# Patient Record
Sex: Male | Born: 1950 | Race: White | Hispanic: Refuse to answer | Marital: Married | Smoking: Never smoker
Health system: Southern US, Community
[De-identification: ages and names within clinical notes are randomized; demographics above are authoritative.]

## PROBLEM LIST (undated history)

## (undated) DIAGNOSIS — J45909 Unspecified asthma, uncomplicated: Secondary | ICD-10-CM

## (undated) DIAGNOSIS — T7840XA Allergy, unspecified, initial encounter: Secondary | ICD-10-CM

## (undated) DIAGNOSIS — M1990 Unspecified osteoarthritis, unspecified site: Secondary | ICD-10-CM

## (undated) DIAGNOSIS — K219 Gastro-esophageal reflux disease without esophagitis: Secondary | ICD-10-CM

## (undated) DIAGNOSIS — C61 Malignant neoplasm of prostate: Secondary | ICD-10-CM

## (undated) DIAGNOSIS — I639 Cerebral infarction, unspecified: Secondary | ICD-10-CM

## (undated) DIAGNOSIS — K649 Unspecified hemorrhoids: Secondary | ICD-10-CM

## (undated) DIAGNOSIS — G7000 Myasthenia gravis without (acute) exacerbation: Secondary | ICD-10-CM

## (undated) DIAGNOSIS — I10 Essential (primary) hypertension: Secondary | ICD-10-CM

## (undated) DIAGNOSIS — H269 Unspecified cataract: Secondary | ICD-10-CM

## (undated) HISTORY — DX: Unspecified hemorrhoids: K64.9

## (undated) HISTORY — PX: OTHER SURGICAL HISTORY: SHX169

## (undated) HISTORY — PX: WISDOM TOOTH EXTRACTION: SHX21

## (undated) HISTORY — PX: PROSTATE BIOPSY: SHX241

## (undated) HISTORY — DX: Unspecified cataract: H26.9

## (undated) HISTORY — DX: Gastro-esophageal reflux disease without esophagitis: K21.9

## (undated) HISTORY — DX: Myasthenia gravis without (acute) exacerbation: G70.00

## (undated) HISTORY — PX: TONSILLECTOMY: SUR1361

## (undated) HISTORY — DX: Allergy, unspecified, initial encounter: T78.40XA

## (undated) HISTORY — DX: Unspecified asthma, uncomplicated: J45.909

## (undated) HISTORY — DX: Essential (primary) hypertension: I10

## (undated) HISTORY — PX: KNEE ARTHROSCOPY: SUR90

---

## 2009-12-17 ENCOUNTER — Ambulatory Visit: Payer: Self-pay | Admitting: Urology

## 2009-12-22 ENCOUNTER — Ambulatory Visit: Payer: Self-pay | Admitting: Urology

## 2009-12-23 ENCOUNTER — Ambulatory Visit: Payer: Self-pay | Admitting: Cardiovascular Disease

## 2011-04-27 HISTORY — PX: OTHER SURGICAL HISTORY: SHX169

## 2011-04-27 HISTORY — PX: COLONOSCOPY: SHX174

## 2011-11-12 DIAGNOSIS — M1A9XX0 Chronic gout, unspecified, without tophus (tophi): Secondary | ICD-10-CM | POA: Insufficient documentation

## 2011-12-16 DIAGNOSIS — Z9889 Other specified postprocedural states: Secondary | ICD-10-CM | POA: Insufficient documentation

## 2013-04-11 DIAGNOSIS — J45909 Unspecified asthma, uncomplicated: Secondary | ICD-10-CM | POA: Insufficient documentation

## 2013-04-11 DIAGNOSIS — E669 Obesity, unspecified: Secondary | ICD-10-CM | POA: Insufficient documentation

## 2013-05-11 DIAGNOSIS — R0600 Dyspnea, unspecified: Secondary | ICD-10-CM | POA: Insufficient documentation

## 2017-01-28 DIAGNOSIS — G7001 Myasthenia gravis with (acute) exacerbation: Secondary | ICD-10-CM | POA: Insufficient documentation

## 2017-11-30 ENCOUNTER — Telehealth: Payer: Self-pay | Admitting: Internal Medicine

## 2017-12-08 ENCOUNTER — Encounter: Payer: Self-pay | Admitting: Internal Medicine

## 2018-02-07 ENCOUNTER — Other Ambulatory Visit: Payer: Self-pay

## 2018-02-07 ENCOUNTER — Ambulatory Visit (AMBULATORY_SURGERY_CENTER): Payer: Medicare Other | Admitting: *Deleted

## 2018-02-07 VITALS — Ht 70.5 in | Wt 296.2 lb

## 2018-02-07 DIAGNOSIS — Z86010 Personal history of colonic polyps: Secondary | ICD-10-CM

## 2018-02-07 MED ORDER — SUPREP BOWEL PREP KIT 17.5-3.13-1.6 GM/177ML PO SOLN: 1.0000 | Freq: Once | ORAL | 0 refills | Status: AC

## 2018-02-07 NOTE — Progress Notes (Signed)
Patient denies any allergies to egg or soy products. Patient denies complications with anesthesia/sedation.  Patient denies oxygen use at home and denies diet medications. Patient denies information on Colonoscopy.

## 2018-02-20 ENCOUNTER — Encounter: Payer: Self-pay | Admitting: Internal Medicine

## 2018-02-20 ENCOUNTER — Ambulatory Visit (AMBULATORY_SURGERY_CENTER): Payer: Medicare Other | Admitting: Internal Medicine

## 2018-02-20 VITALS — BP 135/82 | HR 78 | Temp 97.7°F | Resp 15 | Ht 70.0 in | Wt 296.0 lb

## 2018-02-20 DIAGNOSIS — Z86010 Personal history of colonic polyps: Secondary | ICD-10-CM | POA: Diagnosis present

## 2018-02-20 DIAGNOSIS — D123 Benign neoplasm of transverse colon: Secondary | ICD-10-CM | POA: Diagnosis not present

## 2018-02-20 DIAGNOSIS — K635 Polyp of colon: Secondary | ICD-10-CM

## 2018-02-20 DIAGNOSIS — D124 Benign neoplasm of descending colon: Secondary | ICD-10-CM

## 2018-02-20 DIAGNOSIS — D125 Benign neoplasm of sigmoid colon: Secondary | ICD-10-CM

## 2018-02-20 MED ORDER — SODIUM CHLORIDE 0.9 % IV SOLN: 500.0000 mL | Freq: Once | INTRAVENOUS | Status: DC

## 2018-02-20 NOTE — Progress Notes (Signed)
PT taken to PACU. Monitors in place. VSS. Report given to RN. 

## 2018-02-20 NOTE — Op Note (Signed)
[facility] Patient Name: Vincent Liu Procedure Date: 02/20/2018 9:56 AM MRN: [id number] Endoscopist: Jerene Bears , MD Age: 67 Referring MD:  Date of Birth: Nov 29, 1950 Gender: Male Account #: [id number] Procedure:                Colonoscopy Indications:              High risk colon cancer surveillance: Personal                            history of colonic polyps Medicines:                Monitored Anesthesia Care Procedure:                Pre-Anesthesia Assessment:                           - Prior to the procedure, a History and Physical                            was performed, and patient medications and                            allergies were reviewed. The patient's tolerance of                            previous anesthesia was also reviewed. The risks                            and benefits of the procedure and the sedation                            options and risks were discussed with the patient.                            All questions were answered, and informed consent                            was obtained. Prior Anticoagulants: The patient has                            taken no previous anticoagulant or antiplatelet                            agents. ASA Grade Assessment: III - A patient with                            severe systemic disease. After reviewing the risks                            and benefits, the patient was deemed in                            satisfactory condition to undergo the procedure.  After obtaining informed consent, the colonoscope                            was passed under direct vision. Throughout the                            procedure, the patient's blood pressure, pulse, and                            oxygen saturations were monitored continuously. The                            Colonoscope was introduced through the anus and                            advanced to the cecum, identified by  appendiceal                            orifice and ileocecal valve. The colonoscopy was                            performed without difficulty. The patient tolerated                            the procedure well. The quality of the bowel                            preparation was good. The ileocecal valve,                            appendiceal orifice, and rectum were photographed. Scope In: 10:01:50 AM Scope Out: 10:16:44 AM Scope Withdrawal Time: 0 hours 13 minutes 5 seconds  Total Procedure Duration: 0 hours 14 minutes 54 seconds  Findings:                 The digital rectal exam was normal.                           Two sessile polyps were found in the transverse                            colon. The polyps were 4 to 5 mm in size. These                            polyps were removed with a cold snare. Resection                            and retrieval were complete.                           Three sessile polyps were found in the descending                            colon. The polyps were 3 to  4 mm in size. These                            polyps were removed with a cold snare. Resection                            and retrieval were complete.                           A 6 mm polyp was found in the sigmoid colon. The                            polyp was sessile. The polyp was removed with a                            cold snare. Resection and retrieval were complete.                           Multiple small and large-mouthed diverticula were                            found in the sigmoid colon and descending colon.                           Internal hemorrhoids were found during                            retroflexion. The hemorrhoids were small. Complications:            No immediate complications. Estimated Blood Loss:     Estimated blood loss was minimal. Impression:               - Two 4 to 5 mm polyps in the transverse colon,                            removed with a cold  snare. Resected and retrieved.                           - Three 3 to 4 mm polyps in the descending colon,                            removed with a cold snare. Resected and retrieved.                           - One 6 mm polyp in the sigmoid colon, removed with                            a cold snare. Resected and retrieved.                           - Moderate diverticulosis in the sigmoid colon and  in the descending colon.                           - Small internal hemorrhoids. Recommendation:           - Patient has a contact number available for                            emergencies. The signs and symptoms of potential                            delayed complications were discussed with the                            patient. Return to normal activities tomorrow.                            Written discharge instructions were provided to the                            patient.                           - Resume previous diet.                           - Continue present medications.                           - Await pathology results.                           - Repeat colonoscopy is recommended for                            surveillance. The colonoscopy date will be                            determined after pathology results from today's                            exam become available for review. Jerene Bears, MD 02/20/2018 10:21:02 AM This report has been signed electronically.

## 2018-02-20 NOTE — Progress Notes (Signed)
Called to room to assist during endoscopic procedure.  Patient ID and intended procedure confirmed with present staff. Received instructions for my participation in the procedure from the performing physician.  

## 2018-02-20 NOTE — Progress Notes (Signed)
No problems noted in the recovery room. Maw   IV was removed at 10:36 By Sundra Aland, RN and a clean, dry, gauze dressing was applied.  A nurse had charted IV was removed at 8:00' ish witch was incorrect and I could not change or edit the IV.  maw

## 2018-02-20 NOTE — Progress Notes (Signed)
Pt. Reports no change in his medical or surgical history since his pre-visit 02/07/2018.

## 2018-02-20 NOTE — Patient Instructions (Signed)
YOU HAD AN ENDOSCOPIC PROCEDURE TODAY AT THE [facility]:   Refer to the procedure report that was given to you for any specific questions about what was found during the examination.  If the procedure report does not answer your questions, please call your gastroenterologist to clarify.  If you requested that your care partner not be given the details of your procedure findings, then the procedure report has been included in a sealed envelope for you to review at your convenience later.  YOU SHOULD EXPECT: Some feelings of bloating in the abdomen. Passage of more gas than usual.  Walking can help get rid of the air that was put into your GI tract during the procedure and reduce the bloating. If you had a lower endoscopy (such as a colonoscopy or flexible sigmoidoscopy) you may notice spotting of blood in your stool or on the toilet paper. If you underwent a bowel prep for your procedure, you may not have a normal bowel movement for a few days.  Please Note:  You might notice some irritation and congestion in your nose or some drainage.  This is from the oxygen used during your procedure.  There is no need for concern and it should clear up in a day or so.  SYMPTOMS TO REPORT IMMEDIATELY:   Following lower endoscopy (colonoscopy or flexible sigmoidoscopy):  Excessive amounts of blood in the stool  Significant tenderness or worsening of abdominal pains  Swelling of the abdomen that is new, acute  Fever of 100F or higher   For urgent or emergent issues, a gastroenterologist can be reached at any hour by calling [phone].   DIET:  We do recommend a small meal at first, but then you may proceed to your regular diet.  Drink plenty of fluids but you should avoid alcoholic beverages for 24 hours.  ACTIVITY:  You should plan to take it easy for the rest of today and you should NOT DRIVE or use heavy machinery until tomorrow (because of the sedation medicines used during the test).     FOLLOW UP: Our staff will call the number listed on your records the next business day following your procedure to check on you and address any questions or concerns that you may have regarding the information given to you following your procedure. If we do not reach you, we will leave a message.  However, if you are feeling well and you are not experiencing any problems, there is no need to return our call.  We will assume that you have returned to your regular daily activities without incident.  If any biopsies were taken you will be contacted by phone or by letter within the next 1-3 weeks.  Please call us at [phone] if you have not heard about the biopsies in 3 weeks.    SIGNATURES/CONFIDENTIALITY: You and/or your care partner have signed paperwork which will be entered into your electronic medical record.  These signatures attest to the fact that that the information above on your After Visit Summary has been reviewed and is understood.  Full responsibility of the confidentiality of this discharge information lies with you and/or your care-partner.   Handouts were given to your care partner on polyps, diverticulosis, and hemorrhoids. You may resume your current medications today. Await biopsy results. Please call if any questions or concerns.   

## 2018-02-21 ENCOUNTER — Telehealth: Payer: Self-pay

## 2018-02-21 NOTE — Telephone Encounter (Signed)
  Follow up Call-  Call back number 02/20/2018  Post procedure Call Back phone  # [phone]  Permission to leave phone message Yes  Some recent data might be hidden     Patient questions:  Do you have a fever, pain , or abdominal swelling? No. Pain Score  0 *  Have you tolerated food without any problems? Yes.    Have you been able to return to your normal activities? Yes.    Do you have any questions about your discharge instructions: Diet   No. Medications  No. Follow up visit  No.  Do you have questions or concerns about your Care? No.  Actions: * If pain score is 4 or above: No action needed, pain <4.

## 2018-02-27 ENCOUNTER — Encounter: Payer: Self-pay | Admitting: Internal Medicine

## 2018-11-08 DIAGNOSIS — M25561 Pain in right knee: Secondary | ICD-10-CM | POA: Insufficient documentation

## 2019-07-17 DIAGNOSIS — M1711 Unilateral primary osteoarthritis, right knee: Secondary | ICD-10-CM | POA: Insufficient documentation

## 2019-08-26 ENCOUNTER — Observation Stay (HOSPITAL_COMMUNITY)
Admission: EM | Admit: 2019-08-26 | Discharge: 2019-08-27 | Disposition: A | Discharge status: Home / Self Care | Payer: Medicare Other | Attending: Urology | Admitting: Urology

## 2019-08-26 ENCOUNTER — Emergency Department (HOSPITAL_COMMUNITY): Payer: Medicare Other

## 2019-08-26 ENCOUNTER — Other Ambulatory Visit: Payer: Self-pay

## 2019-08-26 ENCOUNTER — Encounter (HOSPITAL_COMMUNITY): Payer: Self-pay | Admitting: *Deleted

## 2019-08-26 DIAGNOSIS — M8580 Other specified disorders of bone density and structure, unspecified site: Secondary | ICD-10-CM | POA: Diagnosis not present

## 2019-08-26 DIAGNOSIS — Z7982 Long term (current) use of aspirin: Secondary | ICD-10-CM | POA: Diagnosis not present

## 2019-08-26 DIAGNOSIS — K219 Gastro-esophageal reflux disease without esophagitis: Secondary | ICD-10-CM | POA: Diagnosis not present

## 2019-08-26 DIAGNOSIS — R319 Hematuria, unspecified: Secondary | ICD-10-CM

## 2019-08-26 DIAGNOSIS — Z79899 Other long term (current) drug therapy: Secondary | ICD-10-CM | POA: Insufficient documentation

## 2019-08-26 DIAGNOSIS — Z7952 Long term (current) use of systemic steroids: Secondary | ICD-10-CM | POA: Diagnosis not present

## 2019-08-26 DIAGNOSIS — G7000 Myasthenia gravis without (acute) exacerbation: Secondary | ICD-10-CM | POA: Diagnosis not present

## 2019-08-26 DIAGNOSIS — Z8673 Personal history of transient ischemic attack (TIA), and cerebral infarction without residual deficits: Secondary | ICD-10-CM | POA: Insufficient documentation

## 2019-08-26 DIAGNOSIS — J45909 Unspecified asthma, uncomplicated: Secondary | ICD-10-CM | POA: Insufficient documentation

## 2019-08-26 DIAGNOSIS — R310 Gross hematuria: Secondary | ICD-10-CM | POA: Diagnosis not present

## 2019-08-26 DIAGNOSIS — Z7951 Long term (current) use of inhaled steroids: Secondary | ICD-10-CM | POA: Diagnosis not present

## 2019-08-26 DIAGNOSIS — K5730 Diverticulosis of large intestine without perforation or abscess without bleeding: Secondary | ICD-10-CM | POA: Diagnosis not present

## 2019-08-26 DIAGNOSIS — I10 Essential (primary) hypertension: Secondary | ICD-10-CM | POA: Diagnosis not present

## 2019-08-26 DIAGNOSIS — N401 Enlarged prostate with lower urinary tract symptoms: Secondary | ICD-10-CM | POA: Diagnosis not present

## 2019-08-26 DIAGNOSIS — Z20822 Contact with and (suspected) exposure to covid-19: Secondary | ICD-10-CM | POA: Diagnosis not present

## 2019-08-26 LAB — CBC WITH DIFFERENTIAL/PLATELET
Abs Immature Granulocytes: 0.03 10*3/uL (ref 0.00–0.07)
Basophils Absolute: 0.1 10*3/uL (ref 0.0–0.1)
Basophils Relative: 1 %
Eosinophils Absolute: 0.2 10*3/uL (ref 0.0–0.5)
Eosinophils Relative: 2 %
HCT: 43.3 % (ref 39.0–52.0)
Hemoglobin: 13.7 g/dL (ref 13.0–17.0)
Immature Granulocytes: 0 %
Lymphocytes Relative: 15 %
Lymphs Abs: 1.6 10*3/uL (ref 0.7–4.0)
MCH: 26.5 pg (ref 26.0–34.0)
MCHC: 31.6 g/dL (ref 30.0–36.0)
MCV: 83.8 fL (ref 80.0–100.0)
Monocytes Absolute: 0.6 10*3/uL (ref 0.1–1.0)
Monocytes Relative: 6 %
Neutro Abs: 8.4 10*3/uL — ABNORMAL HIGH (ref 1.7–7.7)
Neutrophils Relative %: 76 %
Platelets: 169 10*3/uL (ref 150–400)
RBC: 5.17 MIL/uL (ref 4.22–5.81)
RDW: 14.4 % (ref 11.5–15.5)
WBC: 10.9 10*3/uL — ABNORMAL HIGH (ref 4.0–10.5)
nRBC: 0 % (ref 0.0–0.2)

## 2019-08-26 LAB — BASIC METABOLIC PANEL
Anion gap: 8 (ref 5–15)
BUN: 28 mg/dL — ABNORMAL HIGH (ref 8–23)
CO2: 28 mmol/L (ref 22–32)
Calcium: 9.2 mg/dL (ref 8.9–10.3)
Chloride: 102 mmol/L (ref 98–111)
Creatinine, Ser: 0.92 mg/dL (ref 0.61–1.24)
GFR calc Af Amer: >60 mL/min (ref >60)
GFR calc non Af Amer: >60 mL/min (ref >60)
Glucose, Bld: 103 mg/dL — ABNORMAL HIGH (ref 70–99)
Potassium: 3.8 mmol/L (ref 3.5–5.1)
Sodium: 138 mmol/L (ref 135–145)

## 2019-08-26 MED ORDER — SODIUM CHLORIDE 0.9 % IV BOLUS
1000.0000 mL | Freq: Once | INTRAVENOUS | Status: AC
  Administered 2019-08-26: 1000 mL via INTRAVENOUS

## 2019-08-26 MED ORDER — LIDOCAINE HCL URETHRAL/MUCOSAL 2 % EX GEL
1.0000 "application " | Freq: Once | CUTANEOUS | Status: AC
  Administered 2019-08-26: 1 via URETHRAL
  Filled 2019-08-26: qty 10

## 2019-08-26 NOTE — ED Provider Notes (Signed)
[facility] EMERGENCY DEPARTMENT Provider Note  CSN: [id number] Arrival date & time: 08/26/19 1731    History Chief Complaint  Patient presents with  . Hematuria    HPI  Vincent Liu is a 69 y.o. male with history of elevated PSA followed by urology who presents for evaluation of hematuria, onset 2 days ago. Seen at [facility] today where there was concern of UTI (TNTC RBC, +LE and +Nit but 0 WBC). He did not have any flank pain but was sent to the ED to rule out infected kidney stone. HE reports some urinary hesitancy and passing occasional clots. He reports little PO intake today and decreased UOP.    Past Medical History:  Diagnosis Date  . Allergy   . Asthma   . Cataract    bilateral - just watching  . GERD (gastroesophageal reflux disease)   . Hemorrhoids   . Hypertension   . MG (myasthenia gravis) ([place])     Past Surgical History:  Procedure Laterality Date  . COLONOSCOPY  2013   polyps  . KNEE ARTHROSCOPY Right   . right foot surgery    . TONSILLECTOMY    . WISDOM TOOTH EXTRACTION      Family History  Problem Relation Age of Onset  . Colon cancer Neg Hx   . Rectal cancer Neg Hx   . Stomach cancer Neg Hx     Social History   Tobacco Use  . Smoking status: Never Smoker  . Smokeless tobacco: Never Used  Substance Use Topics  . Alcohol use: Never  . Drug use: Never     Home Medications Prior to Admission medications   Medication Sig Start Date End Date Taking? Authorizing Provider  albuterol (PROAIR HFA) 108 (90 Base) MCG/ACT inhaler Inhale into the lungs. 03/20/13   [provider]  budesonide-formoterol (SYMBICORT) 80-4.5 MCG/ACT inhaler Inhale into the lungs. 08/15/14   [provider]  Calcium Carbonate-Vitamin D (CALCIUM HIGH POTENCY/VITAMIN D) 600-200 MG-UNIT TABS Take by mouth.    [provider]  Mycophenolate Mofetil (CELLCEPT PO) Take 1,250 mg by mouth 2 (two) times daily at 10 AM and 5 PM.    [provider]    omeprazole (PRILOSEC) 20 MG capsule Take by mouth. 04/20/17   [provider]  PREDNISONE PO Take 7.5 mg by mouth.    [provider]  triamcinolone (NASACORT) 55 MCG/ACT AERO nasal inhaler Place into the nose.    [provider]  valsartan-hydrochlorothiazide (DIOVAN-HCT) 160-25 MG tablet Take by mouth. 03/27/13   [provider]     Allergies    Patient has no known allergies.   Review of Systems   Review of Systems  Constitutional: Negative for fever.  HENT: Negative for congestion and sore throat.   Respiratory: Negative for cough and shortness of breath.   Cardiovascular: Negative for chest pain.  Gastrointestinal: Negative for abdominal pain, diarrhea, nausea and vomiting.  Genitourinary: Positive for difficulty urinating and hematuria. Negative for dysuria and flank pain.  Musculoskeletal: Negative for myalgias.  Skin: Negative for rash.  Neurological: Negative for headaches.  Psychiatric/Behavioral: Negative for behavioral problems.     Physical Exam BP 115/72 (BP Location: Left Arm)   Pulse 84   Temp 98.7 F (37.1 C) (Oral)   Resp 16   Ht 6\' 1"  (1.854 m)   Wt 101.6 kg   SpO2 99%   BMI 29.55 kg/m   Physical Exam Vitals and nursing note reviewed.  Constitutional:  Appearance: Normal appearance.  HENT:     Head: Normocephalic and atraumatic.     Nose: Nose normal.     Mouth/Throat:     Mouth: Mucous membranes are moist.  Eyes:     Extraocular Movements: Extraocular movements intact.     Conjunctiva/sclera: Conjunctivae normal.  Cardiovascular:     Rate and Rhythm: Normal rate.  Pulmonary:     Effort: Pulmonary effort is normal.     Breath sounds: Normal breath sounds.  Abdominal:     General: Abdomen is flat.     Palpations: Abdomen is soft.     Tenderness: There is no abdominal tenderness.  Musculoskeletal:        General: No swelling. Normal range of motion.     Cervical back: Neck supple.  Skin:     General: Skin is warm and dry.  Neurological:     General: No focal deficit present.     Mental Status: He is alert.  Psychiatric:        Mood and Affect: Mood normal.      ED Results / Procedures / Treatments   Labs (all labs ordered are listed, but only abnormal results are displayed) Labs Reviewed  URINE CULTURE  BASIC METABOLIC PANEL  CBC WITH DIFFERENTIAL/PLATELET  URINALYSIS, ROUTINE W REFLEX MICROSCOPIC    EKG None  Radiology No results found.  Procedures Procedures  Medications Ordered in the ED Medications  sodium chloride 0.9 % bolus 1,000 mL (1,000 mLs Intravenous New Bag/Given 08/26/19 2055)     ED Course  I have reviewed the triage vital signs and the nursing notes.  Pertinent labs & imaging results that were available during my care of the patient were reviewed by me and considered in my medical decision making (see chart for details).  Clinical Course as of Aug 26 1052  Sun Aug 26, 2019  2211 Patient with hematuria, urinary hesitancy but no definite obstruction. Awaiting CT to eval potential cause of hematuria.    [CS]  2220 CT images and results reviewed.    [CS]  2245 Spoke with Dr. Gloriann Loan, on call for Urology who recommends a 20Fr foley with saline lavage. If able to clear bladder and draining well, may be able to go home with outpatient Urology followup. If unsuccessful, he would like a call back.    [CS]  2324 Care of the patient signed out to Dr. Dayna Barker at the change of shift.    [CS]    Clinical Course User Index [CS] Truddie Hidden, MD    MDM Rules/Calculators/A&P MDM  Final Clinical Impression(s) / ED Diagnoses Final diagnoses:  Hematuria, unspecified type    Rx / DC Orders ED Discharge Orders    None       Truddie Hidden, MD 08/27/19 1054

## 2019-08-26 NOTE — ED Provider Notes (Signed)
11:22 PM Assumed care from Dr. Karle Starch, please see their note for full history, physical and decision making until this point. In brief this is a 69 y.o. year old male who presented to the ED tonight with Hematuria     Here with hematuria. H/o elevated PSA. Urology wants 20 french and irrigation then follow up. If any issues, call back.   Foley placed, >2L NaCl irrigation and still not hemostatic. Called Dr. Gloriann Loan who requests ED to ED transfer for urology evaluation. Accepted to ED by Dr. Wyvonnia Dusky.   Labs, studies and imaging reviewed by myself and considered in medical decision making if ordered. Imaging interpreted by radiology.  Labs Reviewed  BASIC METABOLIC PANEL - Abnormal; Notable for the following components:      Result Value   Glucose, Bld 103 (*)    BUN 28 (*)    All other components within normal limits  CBC WITH DIFFERENTIAL/PLATELET - Abnormal; Notable for the following components:   WBC 10.9 (*)    Neutro Abs 8.4 (*)    All other components within normal limits  URINE CULTURE  URINALYSIS, ROUTINE W REFLEX MICROSCOPIC    CT Renal Stone Study  Final Result      No follow-ups on file.    Tynesia Harral, Corene Cornea, MD 08/27/19 [phone]

## 2019-08-26 NOTE — ED Triage Notes (Signed)
Pt with hematuria for a day and an half.  Pt seen PCP today and dx with infection.  Sent here for CT.

## 2019-08-27 DIAGNOSIS — R310 Gross hematuria: Secondary | ICD-10-CM | POA: Diagnosis present

## 2019-08-27 DIAGNOSIS — Z20822 Contact with and (suspected) exposure to covid-19: Secondary | ICD-10-CM | POA: Diagnosis not present

## 2019-08-27 DIAGNOSIS — M8580 Other specified disorders of bone density and structure, unspecified site: Secondary | ICD-10-CM | POA: Diagnosis not present

## 2019-08-27 DIAGNOSIS — G7000 Myasthenia gravis without (acute) exacerbation: Secondary | ICD-10-CM | POA: Diagnosis not present

## 2019-08-27 DIAGNOSIS — N401 Enlarged prostate with lower urinary tract symptoms: Secondary | ICD-10-CM | POA: Diagnosis not present

## 2019-08-27 DIAGNOSIS — K5730 Diverticulosis of large intestine without perforation or abscess without bleeding: Secondary | ICD-10-CM | POA: Diagnosis not present

## 2019-08-27 DIAGNOSIS — Z7951 Long term (current) use of inhaled steroids: Secondary | ICD-10-CM | POA: Diagnosis not present

## 2019-08-27 DIAGNOSIS — Z7952 Long term (current) use of systemic steroids: Secondary | ICD-10-CM | POA: Diagnosis not present

## 2019-08-27 DIAGNOSIS — Z8673 Personal history of transient ischemic attack (TIA), and cerebral infarction without residual deficits: Secondary | ICD-10-CM | POA: Diagnosis not present

## 2019-08-27 DIAGNOSIS — K219 Gastro-esophageal reflux disease without esophagitis: Secondary | ICD-10-CM | POA: Diagnosis not present

## 2019-08-27 DIAGNOSIS — Z7982 Long term (current) use of aspirin: Secondary | ICD-10-CM | POA: Diagnosis not present

## 2019-08-27 DIAGNOSIS — J45909 Unspecified asthma, uncomplicated: Secondary | ICD-10-CM | POA: Diagnosis not present

## 2019-08-27 DIAGNOSIS — I10 Essential (primary) hypertension: Secondary | ICD-10-CM | POA: Diagnosis not present

## 2019-08-27 DIAGNOSIS — Z79899 Other long term (current) drug therapy: Secondary | ICD-10-CM | POA: Diagnosis not present

## 2019-08-27 LAB — RESPIRATORY PANEL BY RT PCR (FLU A&B, COVID)
Influenza A by PCR: NEGATIVE
Influenza B by PCR: NEGATIVE
SARS Coronavirus 2 by RT PCR: NEGATIVE

## 2019-08-27 LAB — HEMOGLOBIN AND HEMATOCRIT, BLOOD
HCT: 36.9 % — ABNORMAL LOW (ref 39.0–52.0)
Hemoglobin: 12.2 g/dL — ABNORMAL LOW (ref 13.0–17.0)

## 2019-08-27 MED ORDER — DOCUSATE SODIUM 100 MG PO CAPS
100.0000 mg | ORAL_CAPSULE | Freq: Two times a day (BID) | ORAL | Status: DC
  Filled 2019-08-27: qty 1

## 2019-08-27 MED ORDER — ROSUVASTATIN CALCIUM 20 MG PO TABS
40.0000 mg | ORAL_TABLET | Freq: Every day | ORAL | Status: DC
  Administered 2019-08-27: 40 mg via ORAL
  Filled 2019-08-27: qty 2

## 2019-08-27 MED ORDER — BELLADONNA ALKALOIDS-OPIUM 16.2-60 MG RE SUPP: 1.0000 | Freq: Four times a day (QID) | RECTAL | Status: DC | PRN

## 2019-08-27 MED ORDER — HYDROCHLOROTHIAZIDE 25 MG PO TABS
25.0000 mg | ORAL_TABLET | Freq: Every day | ORAL | Status: DC
  Administered 2019-08-27: 25 mg via ORAL
  Filled 2019-08-27: qty 1

## 2019-08-27 MED ORDER — SODIUM CHLORIDE 0.9 % IR SOLN
3000.0000 mL | Status: DC
  Administered 2019-08-27: 3000 mL

## 2019-08-27 MED ORDER — DEXTROSE-NACL 5-0.45 % IV SOLN: INTRAVENOUS | Status: DC

## 2019-08-27 MED ORDER — OXYCODONE HCL 5 MG PO TABS: 5.0000 mg | ORAL_TABLET | ORAL | 0 refills | Status: DC | PRN

## 2019-08-27 MED ORDER — OXYCODONE HCL 5 MG PO TABS: 5.0000 mg | ORAL_TABLET | ORAL | Status: DC | PRN

## 2019-08-27 MED ORDER — VALSARTAN-HYDROCHLOROTHIAZIDE 160-25 MG PO TABS: 1.0000 | ORAL_TABLET | Freq: Every day | ORAL | Status: DC

## 2019-08-27 MED ORDER — IRBESARTAN 150 MG PO TABS
150.0000 mg | ORAL_TABLET | Freq: Every day | ORAL | Status: DC
  Administered 2019-08-27: 150 mg via ORAL
  Filled 2019-08-27: qty 1

## 2019-08-27 MED ORDER — PANTOPRAZOLE SODIUM 40 MG PO TBEC
40.0000 mg | DELAYED_RELEASE_TABLET | Freq: Every day | ORAL | Status: DC
  Administered 2019-08-27: 40 mg via ORAL
  Filled 2019-08-27: qty 1

## 2019-08-27 MED ORDER — ALBUTEROL SULFATE (2.5 MG/3ML) 0.083% IN NEBU: 2.5000 mg | INHALATION_SOLUTION | RESPIRATORY_TRACT | Status: DC | PRN

## 2019-08-27 MED ORDER — ACETAMINOPHEN 500 MG PO TABS
1000.0000 mg | ORAL_TABLET | Freq: Four times a day (QID) | ORAL | Status: DC
  Administered 2019-08-27 (×2): 1000 mg via ORAL
  Filled 2019-08-27 (×2): qty 2

## 2019-08-27 MED ORDER — TAMSULOSIN HCL 0.4 MG PO CAPS: 0.4000 mg | ORAL_CAPSULE | Freq: Every day | ORAL | 3 refills | Status: AC

## 2019-08-27 NOTE — Discharge Summary (Signed)
Physician Discharge Summary  Patient ID: Vincent Liu MRN: [id number] DOB/AGE: 07/10/1950 69 y.o.  Admit date: 08/26/2019 Discharge date: 08/27/2019  Admission Diagnoses:  Discharge Diagnoses:  Active Problems:   Gross hematuria   Discharged Condition: good  Hospital Course: Patient was admitted with gross hematuria with clot retention.  He was started on continuous bladder irrigation.  His urine cleared up on its own with conservative treatment.  Urine was light pink off of CBI and therefore he was discharged home in stable condition with his Foley catheter.  Consults: None  Significant Diagnostic Studies: none  Treatments: CBI  Discharge Exam: Blood pressure 109/73, pulse 83, temperature 98.7 F (37.1 C), temperature source Oral, resp. rate 18, height 6\' 1"  (1.854 m), weight 101.6 kg, SpO2 98 %. General appearance: alert   no acute distress Adequate perfusion of extremities Nonlabored respiration Abdomen soft, nontender, nondistended Three-way Foley catheter in place draining light pink urine off CBI  Disposition: Discharge disposition: 01-Home or Self Care        Allergies as of 08/27/2019   No Known Allergies     Medication List    TAKE these medications   Calcium High Potency/Vitamin D 600-200 MG-UNIT Tabs Generic drug: Calcium Carbonate-Vitamin D Take by mouth.   omeprazole 40 MG capsule Commonly known as: PRILOSEC Take 40 mg by mouth daily.   oxyCODONE 5 MG immediate release tablet Commonly known as: Oxy IR/ROXICODONE Take 1 tablet (5 mg total) by mouth every 4 (four) hours as needed for moderate pain.   Potassium Chloride ER 20 MEQ Tbcr Take 1 tablet by mouth daily.   ProAir HFA 108 (90 Base) MCG/ACT inhaler Generic drug: albuterol Inhale into the lungs.   rosuvastatin 40 MG tablet Commonly known as: CRESTOR Take 40 mg by mouth daily.   tamsulosin 0.4 MG Caps capsule Commonly known as: Flomax Take 1 capsule (0.4 mg total) by mouth  daily.   valsartan-hydrochlorothiazide 160-25 MG tablet Commonly known as: DIOVAN-HCT Take by mouth.        Signed: Marton Redwood, III 08/27/2019, 1:35 PM

## 2019-08-27 NOTE — H&P (Signed)
Urology History and Physical Note   Dr. Link Snuffer, MD  Reason for Consult:  Gross hematuria with urinary retention  HPI: Vincent Liu is seen after presenting with acute urinary retention in the setting of gross hematuria.   This is a 69 y.o. male with past medical history of myasthenia gravis, HTN, GERD, asthma, BPH s/p Greenlight (~8 years ago), now with worsening LUTS and new onset gross hematuria ~48hr ago. Now with urinary retention.   Transferred from [facility].  Had greenlight PVP about 8 years ago for LUTS/BPH. Reports improvement in symptoms; has had worsening stream over the past year. Several episodes of intermittent retention in the past 3 days. Gross hematuria started 2 days ago. Then with complete retention. Denies dysuria. At baseline has mild frequency, sensation of incomplete emptying.   No prior smoking or heavy chemical/dye exposures.  No prior recurrent UTIs.  No prior GU trauma.   When presented to [facility], 20Fr 2-way placed. Irrigated with 2L but persistnet clots. Transferred to [facility].   Hand irrigated at bedside with return of ~150cc of fresh clot to translucent. Started on slow drip CBI after replacing with 22Fr-3way.   Denies chest pain, shortness of breath, leg swelling.  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    Past Medical History: Past Medical History:  Diagnosis Date  . Allergy   . Asthma   . Cataract    bilateral - just watching  . GERD (gastroesophageal reflux disease)   . Hemorrhoids   . Hypertension   . MG (myasthenia gravis) ([place])     Past Surgical History:  Past Surgical History:  Procedure Laterality Date  . COLONOSCOPY  2013   polyps  . KNEE ARTHROSCOPY Right   . right foot surgery    . TONSILLECTOMY    . WISDOM TOOTH EXTRACTION      Medication: No current facility-administered medications for this encounter.   Current Outpatient Medications   Medication Sig Dispense Refill  . albuterol (PROAIR HFA) 108 (90 Base) MCG/ACT inhaler Inhale into the lungs.    . Calcium Carbonate-Vitamin D (CALCIUM HIGH POTENCY/VITAMIN D) 600-200 MG-UNIT TABS Take by mouth.    Marland Kitchen omeprazole (PRILOSEC) 40 MG capsule Take 40 mg by mouth daily.    . Potassium Chloride ER 20 MEQ TBCR Take 1 tablet by mouth daily.    . rosuvastatin (CRESTOR) 40 MG tablet Take 40 mg by mouth daily.    . valsartan-hydrochlorothiazide (DIOVAN-HCT) 160-25 MG tablet Take by mouth.      Allergies: No Known Allergies  Social History: Social History   Tobacco Use  . Smoking status: Never Smoker  . Smokeless tobacco: Never Used  Substance Use Topics  . Alcohol use: Never  . Drug use: Never    Family History Family History  Problem Relation Age of Onset  . Colon cancer Neg Hx   . Rectal cancer Neg Hx   . Stomach cancer Neg Hx     Review of Systems 10 systems were reviewed and are negative except as noted specifically in the HPI.  Objective   Vital signs in last 24 hours: BP 120/86 (BP Location: Left Arm)   Pulse 76   Temp 98.7 F (37.1 C) (Oral)   Resp 16   Ht 6\' 1"  (1.854 m)   Wt 101.6 kg   SpO2 99%   BMI 29.55 kg/m   Physical Exam General: NAD, A&O, resting, appropriate HEENT:  [place]/AT, EOMI, MMM Pulmonary: Normal work of breathing Cardiovascular: HDS, adequate peripheral perfusion Abdomen: Soft, NTTP, nondistended. GU: No SP pain, 20Fr with fresh clot, aspirated/irrigated ~150cc of clot, replaced with 22Fr 3-way, started on slow drip CBI. No CVA tenderness.  Extremities: warm and well perfused Neuro: Appropriate, no focal neurological deficits  Most Recent Labs: Lab Results  Component Value Date   WBC 10.9 (H) 08/26/2019   HGB 13.7 08/26/2019   HCT 43.3 08/26/2019   PLT 169 08/26/2019    Lab Results  Component Value Date   NA 138 08/26/2019   K 3.8 08/26/2019   CL 102 08/26/2019   CO2 28 08/26/2019   BUN 28 (H) 08/26/2019   CREATININE  0.92 08/26/2019   CALCIUM 9.2 08/26/2019   IMAGING: CT Renal Stone Study  Result Date: 08/26/2019 CLINICAL DATA:  69 year old male with hematuria. EXAM: CT ABDOMEN AND PELVIS WITHOUT CONTRAST TECHNIQUE: Multidetector CT imaging of the abdomen and pelvis was performed following the standard protocol without IV contrast. COMPARISON:  None. FINDINGS: Evaluation of this exam is limited in the absence of intravenous contrast. Lower chest: The visualized lung bases are clear. No intra-abdominal free air or free fluid. Hepatobiliary: Subcentimeter hypodense focus over the dome of the liver (5/2) is not characterized. The liver is otherwise unremarkable. No intrahepatic biliary ductal dilatation. No calcified gallstones or pericholecystic fluid. Pancreas: Unremarkable. No pancreatic ductal dilatation or surrounding inflammatory changes. Spleen: Normal in size without focal abnormality. Adrenals/Urinary Tract: The adrenal glands are unremarkable. There is no hydronephrosis or nephrolithiasis on either side. Subcentimeter right renal upper pole hypodense lesion is too small to characterize and not evaluated on this noncontrast CT. The visualized ureters are unremarkable. There is high attenuating content within the bladder consistent with blood product and in keeping with history of hematuria. There is an ill-defined lobulated appearing soft tissue mass at the base of the bladder which may represent invasion of a mass arising from the prostate or a neoplasm arising from the bladder wall. Direct visualization with cystoscopy is recommended. Stomach/Bowel: There is sigmoid diverticulosis without active inflammatory changes. There is no bowel obstruction or active inflammation. The appendix is normal. Vascular/Lymphatic: Mild aortoiliac atherosclerotic disease. The IVC is unremarkable. No portal venous gas. There is no adenopathy. There is mild haziness of the mesentery with multiple top-normal lymph nodes with a "misty  mesentery" appearance. This finding is nonspecific but may be related to underlying inflammatory/infectious etiology. Reproductive: Enlarged prostate gland measuring 6 cm in transverse axial diameter. Other: None Musculoskeletal: Osteopenia with degenerative changes of the spine. No acute osseous pathology. Partially visualized 2 cm lesion in the subcutaneous soft tissues of the left lower posterior thoracic wall demonstrates fluid attenuation, possibly a sebaceous cyst. Additional 15 mm nodular density noted in the subcutaneous soft tissues of the posterior lower thoracic wall (25/2). No drainable fluid collection. IMPRESSION: 1. Blood products within the bladder. Ill-defined lobulated appearing soft tissue mass at the base of the bladder may represent invasion of a mass arising from the prostate or a neoplasm arising from the bladder wall. Direct visualization with cystoscopy is recommended. 2. No hydronephrosis or nephrolithiasis. 3. Sigmoid diverticulosis. No bowel obstruction. Normal appendix. 4. Aortic Atherosclerosis (ICD10-I70.0). Electronically Signed   By: Anner Crete M.D.   On: 08/26/2019 22:12    ------  Assessment:  69 y.o. male with history of myasthenia gravis, HTN, GERD, asthma, BPH s/p Greenlight (~8 years ago), now with worsening LUTS and new onset gross hematuria ~48hr ago. Now with  clot urinary retention.  We discussed possible etiologies including BPH, hypervascular prostate, prior prostatic surgery, bladder cancer, other bladder tumor, UTI.   On presentation was AHDS, mild leukocytosis, no AKI.   Hand irrigated at bedside for 150cc of fresh clot. 22Fr 3-way placed and started on slow drip CBI.   Plan Will plan to admit for CBI.   Tylenol and prn oxycodone VS q8hr Strict I/Os General diet Gentle mIVF CBI, wean to light pink Bowel Regimen  Dispo: Pending clinical course on CBI.

## 2019-08-27 NOTE — ED Provider Notes (Signed)
Patient sent from outside hospital with hematuria to see urology.  He is having minimal drainage from the 20 French catheter.  Discussed with urology Dr. Fredderick Phenix.  He will evaluate and requests hematuria catheter supplies at the bedside.  Patient states he has no complaints and has no pain, nausea or dizziness.  Dr. Fredderick Phenix has seen patient and is performing bladder irrigation and will admit for CBI.   Ezequiel Essex, MD 08/27/19 [phone]

## 2019-08-27 NOTE — Discharge Instructions (Signed)
You may notice some intermittent bleeding through the catheter.  This is okay as long as the catheter is draining.  Be sure to drink an adequate amount of water to flush out your system.  You will be called with an appointment for approximately 1 week to remove your catheter.

## 2019-08-27 NOTE — ED Notes (Signed)
Urology cart at bedside 

## 2019-08-27 NOTE — ED Notes (Signed)
Pt taught how to use leg bag with foley. Pt instructed on how to change leg bag to standard leg bag that was given to patient. Gave urinal for easy emptying of foley bag.

## 2019-12-25 ENCOUNTER — Ambulatory Visit: Payer: Medicare Other

## 2019-12-25 ENCOUNTER — Ambulatory Visit: Payer: Medicare Other | Admitting: Radiation Oncology

## 2020-01-07 NOTE — Progress Notes (Signed)
GU Location of Tumor / Histology: prostatic adenocarcinoma  If Prostate Cancer, Gleason Score is (3 + 4) and PSA is (5.2) on finasteride. Prostate volume: 89.26 grams.  Vincent Liu has a history of greenlight procedure eight years ago. Patient presented to ED with gross hematuria.   Biopsies of prostate (if applicable) revealed:      Past/Anticipated interventions by urology, if any: prostate biopsy, referral to Dr. Tammi Klippel to discuss external beam radiation therapy options  Past/Anticipated interventions by medical oncology, if any: no  Weight changes, if any: no  Bowel/Bladder complaints, if any: IPSS 16. SHIM 1. Reports nocturia x 1. Reports gross dysuria has resolved. Reports urinary urgency. Reports urinary incontinence related to urgency reqiuring him to wear a depends OTC.  Endorses taking finasteride and flomax as directed  Denies any bowel complaints.  Nausea/Vomiting, if any: no  Pain issues, if any:  denies  SAFETY ISSUES:  Prior radiation? denies  Pacemaker/ICD? denies  Possible current pregnancy? no, male patient  Is the patient on methotrexate? denies  Current Complaints / other details:  69 year old male. Married with 2 daughters and 3 sons. Retired. Resides in [place], [place].  Has suffered from myasthenia gravis x 8-9 years

## 2020-01-08 ENCOUNTER — Telehealth: Payer: Self-pay | Admitting: Radiation Oncology

## 2020-01-08 ENCOUNTER — Ambulatory Visit: Payer: Medicare Other

## 2020-01-08 ENCOUNTER — Ambulatory Visit
Admission: RE | Admit: 2020-01-08 | Discharge: 2020-01-08 | Disposition: A | Discharge status: Home / Self Care | Payer: Medicare Other | Source: Ambulatory Visit | Attending: Radiation Oncology | Admitting: Radiation Oncology

## 2020-01-08 DIAGNOSIS — C61 Malignant neoplasm of prostate: Secondary | ICD-10-CM

## 2020-01-08 HISTORY — DX: Malignant neoplasm of prostate: C61

## 2020-01-08 NOTE — Progress Notes (Signed)
Radiation Oncology         [phone] ________________________________  Initial Outpatient Consultation - Conducted via MyChart due to current COVID-19 concerns for limiting patient exposure  Name: Vincent Liu MRN: [id number]  Date: 01/08/2020  DOB: 16-Nov-1950  OM:[id number], [name], MD  [name], MD   REFERRING PHYSICIAN: [name], MD  DIAGNOSIS: 69 y.o. gentleman with Stage T1c adenocarcinoma of the prostate with Gleason score of 3+4, and PSA of 5.2 (10.4 adjusted for finasteride).    ICD-10-CM   1. Malignant neoplasm of prostate ([place])  C61     HISTORY OF PRESENT ILLNESS: Vincent Liu is a 69 y.o. male with a diagnosis of prostate cancer.  He has a longstanding history of BPH with LUTS s/p greenlight prostate vaporization in 2009 in [place], [place]. and a previous negative prostate biopsy in 2013.  More recently, he developed worsening LUTS and presented to the ED at [facility] on 08/26/2019 with gross hematuria and passage of clots.  He underwent renal protocol CT at that time showin an: ill-defined lobulated-appearing soft tissue mass at the base of the bladder, questionable for either invasion of a mass arising from the prostate or a neoplasm arising from the bladder wall.  He was admitted overnight for continuous bladder irrigation and able to discharge home the following day with indwelling Foley catheter.  He was started on flomax at discharge and finasteride the following week.  He followed up with Dr. Gloriann Loan as an outpatient on 09/07/2019.  In office cystoscopy performed that day showed an obstructing prostate with severe hyperplasia and very large, intravesical median lobe with some inflammation but no evidence of bladder tumor.  His PSA was elevated at 7.6 in 06/2019 and a repeat PSA with his PCP on 10/19/2019 showed further elevation to 5.2 (10.4 adjusted for finasteride).  Digital rectal examination from 10/25/2019 with Dr. Gloriann Loan showed nodularity at the  mid apex.  The patient proceeded to transrectal ultrasound with 12 biopsies of the prostate on 11/28/2019.  The prostate volume measured 89.26 cc.  Out of 12 core biopsies, 3 were positive.  The maximum Gleason score was 3+4, and this was seen in the left base lateral, left base (with PNI), and left mid.  The patient reviewed the biopsy results with his urologist and he has kindly been referred today for discussion of potential radiation treatment options.  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  PREVIOUS RADIATION THERAPY: No  PAST MEDICAL HISTORY:  Past Medical History:  Diagnosis Date  . Allergy   . Asthma   . Cataract    bilateral - just watching  . GERD (gastroesophageal reflux disease)   . Hemorrhoids   . Hypertension   . MG (myasthenia gravis) ([place])       PAST SURGICAL HISTORY: Past Surgical History:  Procedure Laterality Date  . COLONOSCOPY  2013   polyps  . KNEE ARTHROSCOPY Right   . right foot surgery    . TONSILLECTOMY    . WISDOM TOOTH EXTRACTION      FAMILY HISTORY:  Family History  Problem Relation Age of Onset  . Colon cancer Neg Hx   . Rectal cancer Neg Hx   . Stomach cancer Neg Hx     SOCIAL HISTORY:  Social History   Socioeconomic History  . Marital status: Married  Spouse name: Not on file  . Number of children: Not on file  . Years of education: Not on file  . Highest education level: Not on file  Occupational History  . Not on file  Tobacco Use  . Smoking status: Never Smoker  . Smokeless tobacco: Never Used  Vaping Use  . Vaping Use: Never used  Substance and Sexual Activity  . Alcohol use: Never  . Drug use: Never  . Sexual activity: Not Currently  Other Topics Concern  . Not on file  Social History Narrative  . Not on file   Social Determinants of Health   Financial Resource Strain:   .  Difficulty of Paying Living Expenses: Not on file  Food Insecurity:   . Worried About Charity fundraiser in the Last Year: Not on file  . Ran Out of Food in the Last Year: Not on file  Transportation Needs:   . Lack of Transportation (Medical): Not on file  . Lack of Transportation (Non-Medical): Not on file  Physical Activity:   . Days of Exercise per Week: Not on file  . Minutes of Exercise per Session: Not on file  Stress:   . Feeling of Stress : Not on file  Social Connections:   . Frequency of Communication with Friends and Family: Not on file  . Frequency of Social Gatherings with Friends and Family: Not on file  . Attends Religious Services: Not on file  . Active Member of Clubs or Organizations: Not on file  . Attends Archivist Meetings: Not on file  . Marital Status: Not on file  Intimate Partner Violence:   . Fear of Current or Ex-Partner: Not on file  . Emotionally Abused: Not on file  . Physically Abused: Not on file  . Sexually Abused: Not on file    ALLERGIES: Patient has no known allergies.  MEDICATIONS:  Current Outpatient Medications  Medication Sig Dispense Refill  . albuterol (PROAIR HFA) 108 (90 Base) MCG/ACT inhaler Inhale into the lungs.    . Calcium Carbonate-Vitamin D (CALCIUM HIGH POTENCY/VITAMIN D) 600-200 MG-UNIT TABS Take by mouth.    Marland Kitchen omeprazole (PRILOSEC) 40 MG capsule Take 40 mg by mouth daily.    Marland Kitchen oxyCODONE (OXY IR/ROXICODONE) 5 MG immediate release tablet Take 1 tablet (5 mg total) by mouth every 4 (four) hours as needed for moderate pain. 6 tablet 0  . Potassium Chloride ER 20 MEQ TBCR Take 1 tablet by mouth daily.    . rosuvastatin (CRESTOR) 40 MG tablet Take 40 mg by mouth daily.    . tamsulosin (FLOMAX) 0.4 MG CAPS capsule Take 1 capsule (0.4 mg total) by mouth daily. 30 capsule 3  . valsartan-hydrochlorothiazide (DIOVAN-HCT) 160-25 MG tablet Take by mouth.     No current facility-administered medications for this encounter.      REVIEW OF SYSTEMS:  On review of systems, the patient reports that he is doing well overall. He denies any chest pain, shortness of breath, cough, fevers, chills, night sweats, unintended weight changes. He denies any bowel disturbances, and denies abdominal pain, nausea or vomiting. He denies any new musculoskeletal or joint aches or pains. His IPSS was 16, indicating moderate urinary symptoms despite taking tamsulosin and finasteride daily. His SHIM was 1, indicating he has severe erectile dysfunction. A complete review of systems is obtained and is otherwise negative.    PHYSICAL EXAM:  Wt Readings from Last 3 Encounters:  08/26/19 224 lb (101.6 kg)  02/20/18 296  lb (134.3 kg)  02/07/18 296 lb 3.2 oz (134.4 kg)   Temp Readings from Last 3 Encounters:  08/26/19 98.7 F (37.1 C) (Oral)  02/20/18 97.7 F (36.5 C) (Temporal)   BP Readings from Last 3 Encounters:  08/27/19 116/80  02/20/18 135/82   Pulse Readings from Last 3 Encounters:  08/27/19 80  02/20/18 78    /10  In general this is a well appearing Caucasian gentleman in no acute distress. He's alert and oriented x4 and appropriate throughout the examination. Cardiopulmonary assessment is negative for acute distress and he exhibits normal effort.    KPS = 80  100 - Normal; no complaints; no evidence of disease. 90   - Able to carry on normal activity; minor signs or symptoms of disease. 80   - Normal activity with effort; some signs or symptoms of disease. 79   - Cares for self; unable to carry on normal activity or to do active work. 60   - Requires occasional assistance, but is able to care for most of his personal needs. 50   - Requires considerable assistance and frequent medical care. 47   - Disabled; requires special care and assistance. 64   - Severely disabled; hospital admission is indicated although death not imminent. 83   - Very sick; hospital admission necessary; active supportive treatment necessary. 10    - Moribund; fatal processes progressing rapidly. 0     - Dead  Karnofsky DA, Abelmann Blair, Craver LS and Burchenal [facility] [phone]) The use of the nitrogen mustards in the palliative treatment of carcinoma: with particular reference to bronchogenic carcinoma Cancer 1 634-56  LABORATORY DATA:  Lab Results  Component Value Date   WBC 10.9 (H) 08/26/2019   HGB 12.2 (L) 08/27/2019   HCT 36.9 (L) 08/27/2019   MCV 83.8 08/26/2019   PLT 169 08/26/2019   Lab Results  Component Value Date   NA 138 08/26/2019   K 3.8 08/26/2019   CL 102 08/26/2019   CO2 28 08/26/2019   No results found for: ALT, AST, GGT, ALKPHOS, BILITOT   RADIOGRAPHY: No results found.    IMPRESSION/PLAN: This visit was conducted via MyChart to spare the patient unnecessary potential exposure in the healthcare setting during the current COVID-19 pandemic. 1. 69 y.o. gentleman with Stage T1c adenocarcinoma of the prostate with Gleason Score of 3+4, and PSA of 10.4 (adjusted for finasteride). We discussed the patient's workup and outlined the nature of prostate cancer in this setting. The patient's T stage, Gleason's score, and PSA put him into the favorable intermediate risk group. Accordingly, he is eligible for a variety of potential treatment options including brachytherapy, 5.5 weeks of external radiation, or prostatectomy. We discussed the available radiation techniques, and focused on the details and logistics and delivery. The patient is not felt to be an ideal candidate for brachytherapy with a prostate volume of 89 cc and significant LUTS despite maximal medical therapy.  Therefore, we discussed and outlined the risks, benefits, short and long-term effects associated with daily external beam radiotherapy and compared and contrasted these with prostatectomy. We discussed the role of SpaceOAR gel in reducing the rectal toxicity associated with radiotherapy.   At the end of the conversation, the patient remains undecided  regarding his treatment preference but appears to be leaning towards 5.5 weeks of external beam radiation therapy here in [place]. We discussed that we have radiation therapy colleagues in [place], [place] that we could refer him to if he wished to be  treated closer to home. He would like to take some additional time to consider and discuss his options with his wife, who was unable to be with [place] during his consult today. He has a scheduled follow-up with Dr. Gloriann Loan on 01/22/2020 to further discuss his treatment options and plans to reach a decision at that time.  We enjoyed meeting him today and look forward to following his progress and/or participating in his care.  He knows that he is welcome to call at anytime with any further questions or concerns regarding radiotherapy.  Otherwise, we will plan to follow-up with him in early October 2021 if we have not heard from him prior to that time regarding his treatment preference.  Given current concerns for patient exposure during the COVID-19 pandemic, this encounter was conducted via video-enabled MyChart visit. The patient has given verbal consent for this type of encounter. The time spent during this encounter was 60 minutes. The attendants for this meeting include Tyler Pita MD, Ashlyn Bruning PA-C, [place], and patient, Kandis Mannan Liu. During the encounter, Tyler Pita MD, Ashlyn Bruning PA-C, and scribe, Wilburn Mylar were located at [facility].  Patient, SEUNG NIDIFFER Liu was located at home.    Nicholos Johns, PA-C    Tyler Pita, MD  [facility] Direct Dial: [phone]  Fax: [phone] [email or web address]  Skype  [email or web address]  This document serves as a record of services personally performed by Tyler Pita, MD and Freeman Caldron, PA-C. It was created on their behalf by Wilburn Mylar, a trained medical scribe. The creation  of this record is based on the scribe's personal observations and the provider's statements to them. This document has been checked and approved by the attending provider.

## 2020-01-09 ENCOUNTER — Other Ambulatory Visit: Payer: Self-pay

## 2020-01-09 ENCOUNTER — Encounter: Payer: Self-pay | Admitting: Radiation Oncology

## 2020-01-09 DIAGNOSIS — Z9181 History of falling: Secondary | ICD-10-CM | POA: Insufficient documentation

## 2020-01-09 DIAGNOSIS — I10 Essential (primary) hypertension: Secondary | ICD-10-CM | POA: Insufficient documentation

## 2020-01-09 DIAGNOSIS — I639 Cerebral infarction, unspecified: Secondary | ICD-10-CM | POA: Insufficient documentation

## 2020-01-09 DIAGNOSIS — K219 Gastro-esophageal reflux disease without esophagitis: Secondary | ICD-10-CM | POA: Insufficient documentation

## 2020-01-24 ENCOUNTER — Encounter: Payer: Self-pay | Admitting: Medical Oncology

## 2020-01-24 ENCOUNTER — Encounter: Payer: Self-pay | Admitting: Urology

## 2020-01-24 ENCOUNTER — Other Ambulatory Visit: Payer: Self-pay | Admitting: Urology

## 2020-01-24 DIAGNOSIS — C61 Malignant neoplasm of prostate: Secondary | ICD-10-CM

## 2020-01-24 NOTE — Progress Notes (Signed)
Vincent Rue, RN has confirmed that the patient wishes to have his daily radiation treatments here in [place].  I have sent a request to [facility] to please coordinate for fiducial markers and SpaceOAR gel with Dr. Gloriann Loan, first available, prior to CT [facility], in anticipation of beginning the 5.5 week course of daily treatments in the near future.  Nicholos Johns, MMS, PA-C [facility] [place]: [phone]   Fax: [phone]

## 2020-01-24 NOTE — Progress Notes (Signed)
Spoke with patient to introduce myself as the prostate nurse navigator and discuss my role. He has decided on 5.5 weeks of radiation to treat his prostate cancer. I asked if he would like to be treated in [place] with Dr. Venia Minks or come to [facility]. He states he would like to be treated here. I informed him, Dr. Purvis Sheffield office will schedule him for gold markers/SpaceOar gel and then Enid Derry will schedule him for CT simulation. I gave him my contact information and asked him to call me with questions or concerns. He voiced understanding.

## 2020-02-04 ENCOUNTER — Other Ambulatory Visit: Payer: Self-pay

## 2020-02-04 ENCOUNTER — Ambulatory Visit (INDEPENDENT_AMBULATORY_CARE_PROVIDER_SITE_OTHER): Payer: Medicare Other | Admitting: Dermatology

## 2020-02-04 ENCOUNTER — Encounter: Payer: Self-pay | Admitting: Dermatology

## 2020-02-04 DIAGNOSIS — D485 Neoplasm of uncertain behavior of skin: Secondary | ICD-10-CM

## 2020-02-04 DIAGNOSIS — Z1283 Encounter for screening for malignant neoplasm of skin: Secondary | ICD-10-CM | POA: Diagnosis not present

## 2020-02-04 DIAGNOSIS — L570 Actinic keratosis: Secondary | ICD-10-CM | POA: Diagnosis not present

## 2020-02-04 NOTE — Patient Instructions (Signed)

## 2020-02-04 NOTE — Progress Notes (Signed)
LN2 x3 on left hand x1 on left forearm x2 on left cheek x5 on right hand

## 2020-02-05 ENCOUNTER — Encounter: Payer: Self-pay | Admitting: Dermatology

## 2020-02-05 NOTE — Addendum Note (Signed)
Addended by: Lavonna Monarch on: 02/05/2020 06:29 AM   Modules accepted: Level of Service

## 2020-02-05 NOTE — Progress Notes (Addendum)
   Follow-Up Visit   Subjective  Vincent Liu is a 69 y.o. male who presents for the following: Annual Exam (no concerns).  General skin examination Location: New crust left ear Duration:  Quality:  Associated Signs/Symptoms: Modifying Factors:  Severity:  Timing: Context:   Objective  Well appearing patient in no apparent distress; mood and affect are within normal limits.  All skin waist up examined.   Assessment & Plan    Neoplasm of uncertain behavior of skin (2) Left Upper Rim of Ear  Skin / nail biopsy Type of biopsy: tangential   Informed consent: discussed and consent obtained   Timeout: patient name, date of birth, surgical site, and procedure verified   Anesthesia: the lesion was anesthetized in a standard fashion   Anesthetic:  1% lidocaine w/ epinephrine 1-100,000 local infiltration Instrument used: flexible razor blade   Hemostasis achieved with: ferric subsulfate   Outcome: patient tolerated procedure well   Post-procedure details: sterile dressing applied and wound care instructions given   Dressing type: bandage and petrolatum    Specimen 1 - Surgical pathology Differential Diagnosis: SCC BCC Check Margins: No  Left Lower Rim of Ear  Skin / nail biopsy Type of biopsy: tangential   Informed consent: discussed and consent obtained   Timeout: patient name, date of birth, surgical site, and procedure verified   Anesthesia: the lesion was anesthetized in a standard fashion   Anesthetic:  1% lidocaine w/ epinephrine 1-100,000 local infiltration Instrument used: flexible razor blade   Hemostasis achieved with: ferric subsulfate   Outcome: patient tolerated procedure well   Post-procedure details: sterile dressing applied and wound care instructions given   Dressing type: bandage and petrolatum    Specimen 2 - Surgical pathology Differential Diagnosis: BCC SCC Check Margins: No  AK (actinic keratosis) (11) Left Hand - Posterior (3); Right  Hand - Posterior (5); Right Forearm - Posterior; Left Malar Cheek (2)  Destruction of lesion - Left Hand - Posterior, Left Malar Cheek, Right Forearm - Posterior, Right Hand - Posterior Complexity: simple   Destruction method: cryotherapy   Informed consent: discussed and consent obtained   Lesion destroyed using liquid nitrogen: Yes   Cryotherapy cycles:  5 Outcome: patient tolerated procedure well with no complications    Skin exam for malignant neoplasm Mid Back  Annual skin examination.   Skin cancer screening performed today.   I, Lavonna Monarch, MD, have reviewed all documentation for this visit.  The documentation on 03/27/20 for the exam, diagnosis, procedures, and orders are all accurate and complete.

## 2020-02-07 ENCOUNTER — Telehealth: Payer: Self-pay | Admitting: *Deleted

## 2020-02-07 NOTE — Telephone Encounter (Signed)
callled patient to remind of MRI and sim appt. for 02-08-20, spoke with patient and he is aware of these appts.

## 2020-02-08 ENCOUNTER — Encounter: Payer: Self-pay | Admitting: Medical Oncology

## 2020-02-08 ENCOUNTER — Ambulatory Visit
Admission: RE | Admit: 2020-02-08 | Discharge: 2020-02-08 | Disposition: A | Discharge status: Home / Self Care | Payer: Medicare Other | Source: Ambulatory Visit | Attending: Radiation Oncology | Admitting: Radiation Oncology

## 2020-02-08 ENCOUNTER — Ambulatory Visit (HOSPITAL_COMMUNITY)
Admission: RE | Admit: 2020-02-08 | Discharge: 2020-02-08 | Disposition: A | Discharge status: Home / Self Care | Payer: Medicare Other | Source: Ambulatory Visit | Attending: Urology | Admitting: Urology

## 2020-02-08 ENCOUNTER — Other Ambulatory Visit: Payer: Self-pay

## 2020-02-08 DIAGNOSIS — C61 Malignant neoplasm of prostate: Secondary | ICD-10-CM

## 2020-02-08 DIAGNOSIS — Z510 Encounter for antineoplastic radiation therapy: Secondary | ICD-10-CM | POA: Diagnosis present

## 2020-02-08 NOTE — Progress Notes (Signed)
  Radiation Oncology         [phone]) [phone] ________________________________  Name: Vincent Liu MRN: [id number]  Date: 02/08/2020  DOB: [birth year]  SIMULATION AND TREATMENT PLANNING NOTE    ICD-10-CM   1. Malignant neoplasm of prostate ([place])  C61     DIAGNOSIS:  69 y.o. gentleman with Stage T1c adenocarcinoma of the prostate with Gleason score of 3+4, and PSA of 5.2 (10.4 adjusted for finasteride).  NARRATIVE:  The patient was brought to the [facility].  Identity was confirmed.  All relevant records and images related to the planned course of therapy were reviewed.  The patient freely provided informed written consent to proceed with treatment after reviewing the details related to the planned course of therapy. The consent form was witnessed and verified by the simulation staff.  Then, the patient was set-up in a stable reproducible supine position for radiation therapy.  A vacuum lock pillow device was custom fabricated to position his legs in a reproducible immobilized position.  Then, I performed a urethrogram under sterile conditions to identify the prostatic apex.  CT images were obtained.  Surface markings were placed.  The CT images were loaded into the planning software.  Then the prostate target and avoidance structures including the rectum, bladder, bowel and hips were contoured.  Treatment planning then occurred.  The radiation prescription was entered and confirmed.  A total of one complex treatment devices was fabricated. I have requested : Intensity Modulated Radiotherapy (IMRT) is medically necessary for this case for the following reason:  Rectal sparing.Marland Kitchen  PLAN:  The patient will receive 70 Gy in 28 fractions.  ________________________________  Sheral Apley Tammi Klippel, M.D.

## 2020-02-11 DIAGNOSIS — Z510 Encounter for antineoplastic radiation therapy: Secondary | ICD-10-CM | POA: Diagnosis not present

## 2020-02-19 ENCOUNTER — Other Ambulatory Visit: Payer: Self-pay

## 2020-02-19 ENCOUNTER — Ambulatory Visit
Admission: RE | Admit: 2020-02-19 | Discharge: 2020-02-19 | Disposition: A | Discharge status: Home / Self Care | Payer: Medicare Other | Source: Ambulatory Visit | Attending: Radiation Oncology | Admitting: Radiation Oncology

## 2020-02-19 ENCOUNTER — Encounter: Payer: Self-pay | Admitting: Medical Oncology

## 2020-02-19 DIAGNOSIS — Z510 Encounter for antineoplastic radiation therapy: Secondary | ICD-10-CM | POA: Diagnosis not present

## 2020-02-20 ENCOUNTER — Ambulatory Visit
Admission: RE | Admit: 2020-02-20 | Discharge: 2020-02-20 | Disposition: A | Discharge status: Home / Self Care | Payer: Medicare Other | Source: Ambulatory Visit | Attending: Radiation Oncology | Admitting: Radiation Oncology

## 2020-02-20 DIAGNOSIS — Z510 Encounter for antineoplastic radiation therapy: Secondary | ICD-10-CM | POA: Diagnosis not present

## 2020-02-21 ENCOUNTER — Ambulatory Visit
Admission: RE | Admit: 2020-02-21 | Discharge: 2020-02-21 | Disposition: A | Discharge status: Home / Self Care | Payer: Medicare Other | Source: Ambulatory Visit | Attending: Radiation Oncology | Admitting: Radiation Oncology

## 2020-02-21 DIAGNOSIS — Z510 Encounter for antineoplastic radiation therapy: Secondary | ICD-10-CM | POA: Diagnosis not present

## 2020-02-22 ENCOUNTER — Other Ambulatory Visit: Payer: Self-pay

## 2020-02-22 ENCOUNTER — Ambulatory Visit
Admission: RE | Admit: 2020-02-22 | Discharge: 2020-02-22 | Disposition: A | Discharge status: Home / Self Care | Payer: Medicare Other | Source: Ambulatory Visit | Attending: Radiation Oncology | Admitting: Radiation Oncology

## 2020-02-22 DIAGNOSIS — Z510 Encounter for antineoplastic radiation therapy: Secondary | ICD-10-CM | POA: Diagnosis not present

## 2020-02-25 ENCOUNTER — Other Ambulatory Visit: Payer: Self-pay

## 2020-02-25 ENCOUNTER — Ambulatory Visit
Admission: RE | Admit: 2020-02-25 | Discharge: 2020-02-25 | Disposition: A | Discharge status: Home / Self Care | Payer: Medicare Other | Source: Ambulatory Visit | Attending: Radiation Oncology | Admitting: Radiation Oncology

## 2020-02-25 DIAGNOSIS — R300 Dysuria: Secondary | ICD-10-CM | POA: Insufficient documentation

## 2020-02-25 DIAGNOSIS — C61 Malignant neoplasm of prostate: Secondary | ICD-10-CM | POA: Insufficient documentation

## 2020-02-26 ENCOUNTER — Ambulatory Visit
Admission: RE | Admit: 2020-02-26 | Discharge: 2020-02-26 | Disposition: A | Discharge status: Home / Self Care | Payer: Medicare Other | Source: Ambulatory Visit | Attending: Radiation Oncology | Admitting: Radiation Oncology

## 2020-02-26 ENCOUNTER — Other Ambulatory Visit: Payer: Self-pay

## 2020-02-26 DIAGNOSIS — R300 Dysuria: Secondary | ICD-10-CM | POA: Diagnosis not present

## 2020-02-27 ENCOUNTER — Ambulatory Visit
Admission: RE | Admit: 2020-02-27 | Discharge: 2020-02-27 | Disposition: A | Discharge status: Home / Self Care | Payer: Medicare Other | Source: Ambulatory Visit | Attending: Radiation Oncology | Admitting: Radiation Oncology

## 2020-02-27 DIAGNOSIS — R300 Dysuria: Secondary | ICD-10-CM | POA: Diagnosis not present

## 2020-02-28 ENCOUNTER — Ambulatory Visit
Admission: RE | Admit: 2020-02-28 | Discharge: 2020-02-28 | Disposition: A | Discharge status: Home / Self Care | Payer: Medicare Other | Source: Ambulatory Visit | Attending: Radiation Oncology | Admitting: Radiation Oncology

## 2020-02-28 DIAGNOSIS — R300 Dysuria: Secondary | ICD-10-CM | POA: Diagnosis not present

## 2020-02-29 ENCOUNTER — Ambulatory Visit
Admission: RE | Admit: 2020-02-29 | Discharge: 2020-02-29 | Disposition: A | Discharge status: Home / Self Care | Payer: Medicare Other | Source: Ambulatory Visit | Attending: Radiation Oncology | Admitting: Radiation Oncology

## 2020-02-29 ENCOUNTER — Other Ambulatory Visit: Payer: Self-pay

## 2020-02-29 DIAGNOSIS — R300 Dysuria: Secondary | ICD-10-CM | POA: Diagnosis not present

## 2020-02-29 DIAGNOSIS — C61 Malignant neoplasm of prostate: Secondary | ICD-10-CM

## 2020-02-29 LAB — URINALYSIS, COMPLETE (UACMP) WITH MICROSCOPIC
Bilirubin Urine: NEGATIVE
Glucose, UA: NEGATIVE mg/dL
Ketones, ur: NEGATIVE mg/dL
Nitrite: NEGATIVE
Protein, ur: 30 mg/dL — AB
Specific Gravity, Urine: 1.02 (ref 1.005–1.030)
pH: 5 (ref 5.0–8.0)

## 2020-02-29 MED ORDER — CIPROFLOXACIN HCL 500 MG PO TABS
500.0000 mg | ORAL_TABLET | Freq: Two times a day (BID) | ORAL | 0 refills | Status: DC
Start: 2020-02-29 — End: 2020-03-03

## 2020-02-29 MED ORDER — CIPROFLOXACIN HCL 500 MG PO TABS: 500.0000 mg | ORAL_TABLET | Freq: Two times a day (BID) | ORAL | 0 refills | Status: DC

## 2020-02-29 NOTE — Addendum Note (Signed)
Encounter addended by: Tyler Pita, MD on: 02/29/2020 1:07 PM  Actions taken: Pharmacy for encounter modified, Order list changed

## 2020-03-02 LAB — URINE CULTURE: Culture: 80000 — AB

## 2020-03-03 ENCOUNTER — Ambulatory Visit
Admission: RE | Admit: 2020-03-03 | Discharge: 2020-03-03 | Disposition: A | Discharge status: Home / Self Care | Payer: Medicare Other | Source: Ambulatory Visit | Attending: Radiation Oncology | Admitting: Radiation Oncology

## 2020-03-03 ENCOUNTER — Other Ambulatory Visit: Payer: Self-pay | Admitting: Radiation Oncology

## 2020-03-03 DIAGNOSIS — R300 Dysuria: Secondary | ICD-10-CM | POA: Diagnosis not present

## 2020-03-03 MED ORDER — NITROFURANTOIN MONOHYD MACRO 100 MG PO CAPS: 100.0000 mg | ORAL_CAPSULE | Freq: Two times a day (BID) | ORAL | 0 refills | Status: AC

## 2020-03-03 NOTE — Addendum Note (Signed)
Encounter addended by: Tyler Pita, MD on: 03/03/2020 9:40 AM  Actions taken: Results reviewed in IB

## 2020-03-04 ENCOUNTER — Ambulatory Visit
Admission: RE | Admit: 2020-03-04 | Discharge: 2020-03-04 | Disposition: A | Discharge status: Home / Self Care | Payer: Medicare Other | Source: Ambulatory Visit | Attending: Radiation Oncology | Admitting: Radiation Oncology

## 2020-03-04 DIAGNOSIS — R300 Dysuria: Secondary | ICD-10-CM | POA: Diagnosis not present

## 2020-03-05 ENCOUNTER — Ambulatory Visit
Admission: RE | Admit: 2020-03-05 | Discharge: 2020-03-05 | Disposition: A | Discharge status: Home / Self Care | Payer: Medicare Other | Source: Ambulatory Visit | Attending: Radiation Oncology | Admitting: Radiation Oncology

## 2020-03-05 DIAGNOSIS — R300 Dysuria: Secondary | ICD-10-CM | POA: Diagnosis not present

## 2020-03-06 ENCOUNTER — Ambulatory Visit
Admission: RE | Admit: 2020-03-06 | Discharge: 2020-03-06 | Disposition: A | Discharge status: Home / Self Care | Payer: Medicare Other | Source: Ambulatory Visit | Attending: Radiation Oncology | Admitting: Radiation Oncology

## 2020-03-06 DIAGNOSIS — R300 Dysuria: Secondary | ICD-10-CM | POA: Diagnosis not present

## 2020-03-07 ENCOUNTER — Ambulatory Visit
Admission: RE | Admit: 2020-03-07 | Discharge: 2020-03-07 | Disposition: A | Discharge status: Home / Self Care | Payer: Medicare Other | Source: Ambulatory Visit | Attending: Radiation Oncology | Admitting: Radiation Oncology

## 2020-03-07 DIAGNOSIS — R300 Dysuria: Secondary | ICD-10-CM | POA: Diagnosis not present

## 2020-03-10 ENCOUNTER — Ambulatory Visit
Admission: RE | Admit: 2020-03-10 | Discharge: 2020-03-10 | Disposition: A | Discharge status: Home / Self Care | Payer: Medicare Other | Source: Ambulatory Visit | Attending: Radiation Oncology | Admitting: Radiation Oncology

## 2020-03-10 DIAGNOSIS — R300 Dysuria: Secondary | ICD-10-CM | POA: Diagnosis not present

## 2020-03-11 ENCOUNTER — Other Ambulatory Visit: Payer: Self-pay

## 2020-03-11 ENCOUNTER — Ambulatory Visit
Admission: RE | Admit: 2020-03-11 | Discharge: 2020-03-11 | Disposition: A | Discharge status: Home / Self Care | Payer: Medicare Other | Source: Ambulatory Visit | Attending: Radiation Oncology | Admitting: Radiation Oncology

## 2020-03-11 DIAGNOSIS — R300 Dysuria: Secondary | ICD-10-CM | POA: Diagnosis not present

## 2020-03-12 ENCOUNTER — Ambulatory Visit
Admission: RE | Admit: 2020-03-12 | Discharge: 2020-03-12 | Disposition: A | Discharge status: Home / Self Care | Payer: Medicare Other | Source: Ambulatory Visit | Attending: Radiation Oncology | Admitting: Radiation Oncology

## 2020-03-12 DIAGNOSIS — R300 Dysuria: Secondary | ICD-10-CM | POA: Diagnosis not present

## 2020-03-13 ENCOUNTER — Ambulatory Visit
Admission: RE | Admit: 2020-03-13 | Discharge: 2020-03-13 | Disposition: A | Discharge status: Home / Self Care | Payer: Medicare Other | Source: Ambulatory Visit | Attending: Radiation Oncology | Admitting: Radiation Oncology

## 2020-03-13 DIAGNOSIS — R300 Dysuria: Secondary | ICD-10-CM | POA: Diagnosis not present

## 2020-03-14 ENCOUNTER — Ambulatory Visit
Admission: RE | Admit: 2020-03-14 | Discharge: 2020-03-14 | Disposition: A | Discharge status: Home / Self Care | Payer: Medicare Other | Source: Ambulatory Visit | Attending: Radiation Oncology | Admitting: Radiation Oncology

## 2020-03-14 DIAGNOSIS — R300 Dysuria: Secondary | ICD-10-CM | POA: Diagnosis not present

## 2020-03-17 ENCOUNTER — Ambulatory Visit
Admission: RE | Admit: 2020-03-17 | Discharge: 2020-03-17 | Disposition: A | Discharge status: Home / Self Care | Payer: Medicare Other | Source: Ambulatory Visit | Attending: Radiation Oncology | Admitting: Radiation Oncology

## 2020-03-17 ENCOUNTER — Other Ambulatory Visit: Payer: Self-pay

## 2020-03-17 DIAGNOSIS — R300 Dysuria: Secondary | ICD-10-CM | POA: Diagnosis not present

## 2020-03-18 ENCOUNTER — Ambulatory Visit
Admission: RE | Admit: 2020-03-18 | Discharge: 2020-03-18 | Disposition: A | Discharge status: Home / Self Care | Payer: Medicare Other | Source: Ambulatory Visit | Attending: Radiation Oncology | Admitting: Radiation Oncology

## 2020-03-18 DIAGNOSIS — R300 Dysuria: Secondary | ICD-10-CM | POA: Diagnosis not present

## 2020-03-19 ENCOUNTER — Ambulatory Visit
Admission: RE | Admit: 2020-03-19 | Discharge: 2020-03-19 | Disposition: A | Discharge status: Home / Self Care | Payer: Medicare Other | Source: Ambulatory Visit | Attending: Radiation Oncology | Admitting: Radiation Oncology

## 2020-03-19 DIAGNOSIS — R300 Dysuria: Secondary | ICD-10-CM | POA: Diagnosis not present

## 2020-03-24 ENCOUNTER — Ambulatory Visit
Admission: RE | Admit: 2020-03-24 | Discharge: 2020-03-24 | Disposition: A | Discharge status: Home / Self Care | Payer: Medicare Other | Source: Ambulatory Visit | Attending: Radiation Oncology | Admitting: Radiation Oncology

## 2020-03-24 DIAGNOSIS — R300 Dysuria: Secondary | ICD-10-CM | POA: Diagnosis not present

## 2020-03-25 ENCOUNTER — Ambulatory Visit
Admission: RE | Admit: 2020-03-25 | Discharge: 2020-03-25 | Disposition: A | Discharge status: Home / Self Care | Payer: Medicare Other | Source: Ambulatory Visit | Attending: Radiation Oncology | Admitting: Radiation Oncology

## 2020-03-25 DIAGNOSIS — R300 Dysuria: Secondary | ICD-10-CM | POA: Diagnosis not present

## 2020-03-26 ENCOUNTER — Ambulatory Visit
Admission: RE | Admit: 2020-03-26 | Discharge: 2020-03-26 | Disposition: A | Discharge status: Home / Self Care | Payer: Medicare Other | Source: Ambulatory Visit | Attending: Radiation Oncology | Admitting: Radiation Oncology

## 2020-03-26 DIAGNOSIS — C61 Malignant neoplasm of prostate: Secondary | ICD-10-CM | POA: Insufficient documentation

## 2020-03-26 DIAGNOSIS — R300 Dysuria: Secondary | ICD-10-CM | POA: Insufficient documentation

## 2020-03-27 ENCOUNTER — Ambulatory Visit
Admission: RE | Admit: 2020-03-27 | Discharge: 2020-03-27 | Disposition: A | Discharge status: Home / Self Care | Payer: Medicare Other | Source: Ambulatory Visit | Attending: Radiation Oncology | Admitting: Radiation Oncology

## 2020-03-27 DIAGNOSIS — R300 Dysuria: Secondary | ICD-10-CM | POA: Diagnosis not present

## 2020-03-28 ENCOUNTER — Ambulatory Visit
Admission: RE | Admit: 2020-03-28 | Discharge: 2020-03-28 | Disposition: A | Discharge status: Home / Self Care | Payer: Medicare Other | Source: Ambulatory Visit | Attending: Radiation Oncology | Admitting: Radiation Oncology

## 2020-03-28 DIAGNOSIS — R300 Dysuria: Secondary | ICD-10-CM | POA: Diagnosis not present

## 2020-03-31 ENCOUNTER — Encounter: Payer: Self-pay | Admitting: Medical Oncology

## 2020-03-31 ENCOUNTER — Encounter: Payer: Self-pay | Admitting: Urology

## 2020-03-31 ENCOUNTER — Ambulatory Visit
Admission: RE | Admit: 2020-03-31 | Discharge: 2020-03-31 | Disposition: A | Discharge status: Home / Self Care | Payer: Medicare Other | Source: Ambulatory Visit | Attending: Radiation Oncology | Admitting: Radiation Oncology

## 2020-03-31 DIAGNOSIS — R300 Dysuria: Secondary | ICD-10-CM | POA: Diagnosis not present

## 2020-04-21 ENCOUNTER — Encounter: Payer: Self-pay | Admitting: Urology

## 2020-04-21 ENCOUNTER — Telehealth: Payer: Self-pay

## 2020-04-21 NOTE — Telephone Encounter (Signed)
Spoke with patient in regards to telephone follow-up appointment with Marcello Fennel PA on 05/01/20 !@ 10:am. Patient verbalized understanding of appointment date and time. Reviewed meaningful use, AUA and prostate questions. TM

## 2020-04-21 NOTE — Progress Notes (Signed)
Patient has telephone follow-up with Ashlyn Bruning PA. Patient states that he is currently taking Flomax as directed. Patient states nocturia 3 times per night. Patient states mild dysuria. Patient states urine stream is moderate and intermittent. Patient states bowel movements are soft. Patient states that he is emptying his bladder completely most of the time. Patient state having urgency and difficulty holding his urine. Patient states having leakage and wear protection. Patient denies straining to start the flow of urine. Patient states that he has a follow-up appointment with [facility] in March.

## 2020-04-30 NOTE — Progress Notes (Signed)
  Radiation Oncology         [phone] ________________________________  Name: Vincent Liu MRN: [id number]  Date: 03/31/2020  DOB: 1950/06/01  End of Treatment Note  Diagnosis:   70 y.o. gentleman with Stage T1c adenocarcinoma of the prostate with Gleason score of 3+4, and PSA of 5.2 (10.4 adjusted for finasteride).     Indication for treatment:  Curative, Definitive Radiotherapy       Radiation treatment dates:   02/19/20 - 03/31/20  Site/dose:   The prostate was treated to 70 Gy in 28 fractions of 2.5 Gy  Beams/energy:   The patient was treated with IMRT using volumetric arc therapy delivering 6 MV X-rays to clockwise and counterclockwise circumferential arcs with a 90 degree collimator offset to avoid dose scalloping.  Image guidance was performed with daily cone beam CT prior to each fraction to align to gold markers in the prostate and assure proper bladder and rectal fill volumes.  Immobilization was achieved with BodyFix custom mold.  Narrative: The patient tolerated radiation treatment relatively well with only minor urinary irritation and modest fatigue.  He reported increased dysuria, nocturia, frequency, urgency, hesitancy, intermittency and weak stream.  He was treated for a UTI and completed Macrobid as prescribed with improvement in his LUTS.  He specifically denied gross hematuria, flank pain, suprapubic discomfort, fever, chills or night sweats.  He did experience some loose stools during the course of treatment.  Plan: The patient has completed radiation treatment. He will return to radiation oncology clinic for routine followup in one month. I advised him to call or return sooner if he has any questions or concerns related to his recovery or treatment. ________________________________  Artist Pais. Kathrynn Running, M.D.

## 2020-04-30 NOTE — Progress Notes (Signed)
Radiation Oncology         [phone] ________________________________  Name: Vincent Liu MRN: [id number]  Date: 05/01/2020  DOB: [birth year]  Post Treatment Note  CC: Vincent, [name], MD  Vincent Mallow, MD  Diagnosis:   70 y.o. gentleman with Stage T1c adenocarcinoma of the prostate with Gleason score of 3+4, and PSA of 5.2 (10.4 adjusted for finasteride).  Interval Since Last Radiation:  4 weeks  02/19/20 - 03/31/20:   The prostate was treated to 70 Gy in 28 fractions of 2.5 Gy  Narrative: I spoke with the patient to conduct his routine scheduled 1 month follow up visit via telephone to spare the patient unnecessary potential exposure in the healthcare setting during the current COVID-19 pandemic.  The patient was notified in advance and gave permission to proceed with this visit format.  He tolerated radiation treatment relatively well with only minor urinary irritation and modest fatigue.  He reported increased dysuria, nocturia, frequency, urgency, hesitancy, intermittency and weak stream. He was treated for a UTI and completed Macrobid as prescribed with improvement in his LUTS.  He specifically denied gross hematuria, flank pain, suprapubic discomfort, fever, chills or night sweats.  He did experience some loose stools during the course of treatment.                              On review of systems, the patient states that he is doing well in general.  He continues with nocturia x3 as well as increased frequency and urgency, mild hesitancy/intermittency and occasional mild dysuria.  He denies gross hematuria, straining to void, incomplete emptying or incontinence.  His current IPSS score is 16 indicating moderate urinary symptoms.  He continues taking Flomax daily as prescribed.  He reports a healthy appetite and is maintaining his weight.  He denies any abdominal pain, nausea, vomiting, diarrhea or constipation.  He does feel like his fatigue is gradually improving  and overall, he is quite pleased with his progress to date.  ALLERGIES:  has No Known Allergies.  Meds: Current Outpatient Medications  Medication Sig Dispense Refill  . ALBUTEROL SULFATE IN albuterol sulfate HFA 90 mcg/actuation aerosol inhaler  INHALE 2 PUFFS BY MOUTH EVERY 4 TO 6 HOURS    . aspirin 81 MG EC tablet Take by mouth.    . Calcium Carbonate-Vitamin D 600-200 MG-UNIT TABS Take by mouth.    . finasteride (PROSCAR) 5 MG tablet Take 5 mg by mouth daily.    . Fluticasone-Salmeterol (ADVAIR) 250-50 MCG/DOSE AEPB Advair Diskus 250 mcg-50 mcg/dose powder for inhalation  INHALE 1 DOSE BY MOUTH TWICE DAILY    . ipratropium (ATROVENT) 0.02 % nebulizer solution ipratropium bromide 0.02 % solution for inhalation  USE 1 VIAL IN NEBUIZER 4 TIMES A DAY    . ipratropium (ATROVENT) 0.03 % nasal spray ipratropium bromide 21 mcg (0.03 %) nasal spray  USE 2 SPRAY(S) IN EACH NOSTRIL TWICE DAILY    . mycophenolate (CELLCEPT) 500 MG tablet mycophenolate mofetil 500 mg tablet  TAKE 3 TABLETS BY MOUTH ONCE DAILY IN THE MORNING AND 2 TABS IN THE EVENING    . omeprazole (PRILOSEC) 40 MG capsule Take 40 mg by mouth daily.    Vincent Liu oxyCODONE (OXY IR/ROXICODONE) 5 MG immediate release tablet Take 1 tablet (5 mg total) by mouth every 4 (four) hours as needed for moderate pain. 6 tablet 0  . potassium chloride SA (KLOR-CON) 20  MEQ tablet potassium chloride ER 20 mEq tablet,extended release  TAKE 1 TABLET BY MOUTH ONCE DAILY    . rosuvastatin (CRESTOR) 40 MG tablet Take 40 mg by mouth daily.    . tamsulosin (FLOMAX) 0.4 MG CAPS capsule Take 1 capsule (0.4 mg total) by mouth daily. 30 capsule 3  . valsartan-hydrochlorothiazide (DIOVAN-HCT) 160-25 MG tablet Take by mouth.     No current facility-administered medications for this encounter.    Physical Findings:  vitals were not taken for this visit.   /Unable to assess due to telephone follow-up visit format.  Lab Findings: Lab Results  Component Value  Date   WBC 10.9 (H) 08/26/2019   HGB 12.2 (L) 08/27/2019   HCT 36.9 (L) 08/27/2019   MCV 83.8 08/26/2019   PLT 169 08/26/2019     Radiographic Findings: No results found.  Impression/Plan: 1. 70 y.o. gentleman with Stage T1c adenocarcinoma of the prostate with Gleason score of 3+4, and PSA of 5.2 (10.4 adjusted for finasteride). He will continue to follow up with urology for ongoing PSA determinations and has an appointment scheduled with Dr. Alvester Morin on 06/27/2020. He understands what to expect with regards to PSA monitoring going forward.  He will continue taking Flomax daily as prescribed and I will look forward to following his response to treatment via correspondence with urology. We would be happy to continue to participate in his care if clinically indicated in the future and he knows that he is welcome to call at anytime with any questions or concerns. I talked to the patient about what to expect in the future, including his risk for erectile dysfunction and rectal bleeding. I encouraged him to call or return to the office if he has any questions regarding his previous radiation or possible radiation side effects. He was comfortable with this plan and will follow up as needed.    [name], PA-C

## 2020-05-01 ENCOUNTER — Ambulatory Visit
Admission: RE | Admit: 2020-05-01 | Discharge: 2020-05-01 | Disposition: A | Discharge status: Home / Self Care | Payer: Medicare Other | Source: Ambulatory Visit | Attending: Urology | Admitting: Urology

## 2020-05-01 ENCOUNTER — Other Ambulatory Visit: Payer: Self-pay

## 2020-05-01 DIAGNOSIS — C61 Malignant neoplasm of prostate: Secondary | ICD-10-CM

## 2020-06-18 ENCOUNTER — Ambulatory Visit: Payer: Self-pay | Admitting: Orthopedic Surgery

## 2020-08-05 NOTE — Progress Notes (Signed)
DUE TO COVID-19 ONLY ONE VISITOR IS ALLOWED TO COME WITH YOU AND STAY IN THE WAITING ROOM ONLY DURING PRE OP AND PROCEDURE DAY OF SURGERY. THE 1 VISITOR  MAY VISIT WITH YOU AFTER SURGERY IN YOUR PRIVATE ROOM DURING VISITING HOURS ONLY!  YOU NEED TO HAVE A COVID 19 TEST ON__4/18/22 _____ @___1045  am ____, THIS TEST MUST BE DONE BEFORE SURGERY,  COVID TESTING SITE [address] [place] [address], IT IS ON THE RIGHT GOING OUT [address] APPROXIMATELY  2 MINUTES PAST [employer] ON THE RIGHT. ONCE YOUR COVID TEST IS COMPLETED,  PLEASE BEGIN THE QUARANTINE INSTRUCTIONS AS OUTLINED IN YOUR HANDOUT.                Vincent Liu  08/05/2020   Your procedure is scheduled on:  08/13/2020   Report to [facility] Main  Entrance   Report to admitting at   [place] AM     Call this number if you have problems the morning of surgery [phone]    REMEMBER: NO  SOLID FOOD CANDY OR GUM AFTER MIDNIGHT. CLEAR LIQUIDS UNTIL  0530am        . NOTHING BY MOUTH EXCEPT CLEAR LIQUIDS UNTIL     0530am   . PLEASE FINISH ENSURE DRINK PER SURGEON ORDER  WHICH NEEDS TO BE COMPLETED AT 0530am     .      CLEAR LIQUID DIET   Foods Allowed                                                                    Coffee and tea, regular and decaf                            Fruit ices (not with fruit pulp)                                      Iced Popsicles                                    Carbonated beverages, regular and diet                                    Cranberry, grape and apple juices Sports drinks like Gatorade Lightly seasoned clear broth or consume(fat free) Sugar, honey syrup ___________________________________________________________________      BRUSH YOUR TEETH MORNING OF SURGERY AND RINSE YOUR MOUTH OUT, NO CHEWING GUM CANDY OR MINTS.     Take these medicines the morning of surgery with A SIP OF WATER:  Inhalers as usual and bring, Cellcept, Flomax , proscar   DO NOT TAKE ANY DIABETIC MEDICATIONS DAY OF YOUR SURGERY                               You may not have any metal on your body including hair pins and  piercings  Do not wear jewelry, make-up, lotions, powders or perfumes, deodorant             Do not wear nail polish on your fingernails.  Do not shave  48 hours prior to surgery.              Men may shave face and neck.   Do not bring valuables to the hospital. [place].  Contacts, dentures or bridgework may not be worn into surgery.  Leave suitcase in the car. After surgery it may be brought to your room.     Patients discharged the day of surgery will not be allowed to drive home. IF YOU ARE HAVING SURGERY AND GOING HOME THE SAME DAY, YOU MUST HAVE AN ADULT TO DRIVE YOU HOME AND BE WITH YOU FOR 24 HOURS. YOU MAY GO HOME BY TAXI OR UBER OR ORTHERWISE, BUT AN ADULT MUST ACCOMPANY YOU HOME AND STAY WITH YOU FOR 24 HOURS.  Name and phone number of your driver:  Special Instructions: N/A              Please read over the following fact sheets you were given: _____________________________________________________________________  [facility] - Preparing for Surgery Before surgery, you can play an important role.  Because skin is not sterile, your skin needs to be as free of germs as possible.  You can reduce the number of germs on your skin by washing with CHG (chlorahexidine gluconate) soap before surgery.  CHG is an antiseptic cleaner which kills germs and bonds with the skin to continue killing germs even after washing. Please DO NOT use if you have an allergy to CHG or antibacterial soaps.  If your skin becomes reddened/irritated stop using the CHG and inform your nurse when you arrive at Short Stay. Do not shave (including legs and underarms) for at least 48 hours prior to the first CHG shower.  You may shave your face/neck. Please follow these instructions carefully:  1.   Shower with CHG Soap the night before surgery and the  morning of Surgery.  2.  If you choose to wash your hair, wash your hair first as usual with your  normal  shampoo.  3.  After you shampoo, rinse your hair and body thoroughly to remove the  shampoo.                           4.  Use CHG as you would any other liquid soap.  You can apply chg directly  to the skin and wash                       Gently with a scrungie or clean washcloth.  5.  Apply the CHG Soap to your body ONLY FROM THE NECK DOWN.   Do not use on face/ open                           Wound or open sores. Avoid contact with eyes, ears mouth and genitals (private parts).                       Wash face,  Genitals (private parts) with your normal soap.             6.  Wash thoroughly, paying special attention to the area where your surgery  will be performed.  7.  Thoroughly rinse your body with warm water from the neck down.  8.  DO NOT shower/wash with your normal soap after using and rinsing off  the CHG Soap.                9.  Pat yourself dry with a clean towel.            10.  Wear clean pajamas.            11.  Place clean sheets on your bed the night of your first shower and do not  sleep with pets. Day of Surgery : Do not apply any lotions/deodorants the morning of surgery.  Please wear clean clothes to the hospital/surgery center.  FAILURE TO FOLLOW THESE INSTRUCTIONS MAY RESULT IN THE CANCELLATION OF YOUR SURGERY PATIENT SIGNATURE_________________________________  NURSE SIGNATURE__________________________________  ________________________________________________________________________

## 2020-08-11 ENCOUNTER — Other Ambulatory Visit: Payer: Self-pay

## 2020-08-11 ENCOUNTER — Encounter (HOSPITAL_COMMUNITY): Payer: Self-pay

## 2020-08-11 ENCOUNTER — Other Ambulatory Visit (HOSPITAL_COMMUNITY)
Admission: RE | Admit: 2020-08-11 | Discharge: 2020-08-11 | Disposition: A | Discharge status: Home / Self Care | Payer: Medicare Other | Source: Ambulatory Visit | Attending: Specialist | Admitting: Specialist

## 2020-08-11 ENCOUNTER — Encounter (HOSPITAL_COMMUNITY)
Admission: RE | Admit: 2020-08-11 | Discharge: 2020-08-11 | Disposition: A | Discharge status: Home / Self Care | Payer: Medicare Other | Source: Ambulatory Visit | Attending: Specialist | Admitting: Specialist

## 2020-08-11 DIAGNOSIS — Z01812 Encounter for preprocedural laboratory examination: Secondary | ICD-10-CM | POA: Insufficient documentation

## 2020-08-11 DIAGNOSIS — Z20822 Contact with and (suspected) exposure to covid-19: Secondary | ICD-10-CM | POA: Insufficient documentation

## 2020-08-11 HISTORY — DX: Unspecified osteoarthritis, unspecified site: M19.90

## 2020-08-11 HISTORY — DX: Cerebral infarction, unspecified: I63.9

## 2020-08-11 LAB — PROTIME-INR
INR: 1.1 (ref 0.8–1.2)
Prothrombin Time: 14.1 seconds (ref 11.4–15.2)

## 2020-08-11 LAB — BASIC METABOLIC PANEL
Anion gap: 7 (ref 5–15)
BUN: 21 mg/dL (ref 8–23)
CO2: 29 mmol/L (ref 22–32)
Calcium: 9 mg/dL (ref 8.9–10.3)
Chloride: 107 mmol/L (ref 98–111)
Creatinine, Ser: 0.91 mg/dL (ref 0.61–1.24)
GFR, Estimated: >60 mL/min (ref >60)
Glucose, Bld: 93 mg/dL (ref 70–99)
Potassium: 4.2 mmol/L (ref 3.5–5.1)
Sodium: 143 mmol/L (ref 135–145)

## 2020-08-11 LAB — URINALYSIS, ROUTINE W REFLEX MICROSCOPIC
Bilirubin Urine: NEGATIVE
Glucose, UA: NEGATIVE mg/dL
Ketones, ur: NEGATIVE mg/dL
Nitrite: NEGATIVE
Protein, ur: 30 mg/dL — AB
Specific Gravity, Urine: 1.018 (ref 1.005–1.030)
WBC, UA: >50 WBC/hpf — ABNORMAL HIGH (ref 0–5)
pH: 6 (ref 5.0–8.0)

## 2020-08-11 LAB — CBC
HCT: 41.4 % (ref 39.0–52.0)
Hemoglobin: 13.4 g/dL (ref 13.0–17.0)
MCH: 27.7 pg (ref 26.0–34.0)
MCHC: 32.4 g/dL (ref 30.0–36.0)
MCV: 85.7 fL (ref 80.0–100.0)
Platelets: 138 10*3/uL — ABNORMAL LOW (ref 150–400)
RBC: 4.83 MIL/uL (ref 4.22–5.81)
RDW: 14.2 % (ref 11.5–15.5)
WBC: 7.4 10*3/uL (ref 4.0–10.5)
nRBC: 0 % (ref 0.0–0.2)

## 2020-08-11 LAB — SURGICAL PCR SCREEN
MRSA, PCR: NEGATIVE
Staphylococcus aureus: NEGATIVE

## 2020-08-11 LAB — SARS CORONAVIRUS 2 (TAT 6-24 HRS): SARS Coronavirus 2: NEGATIVE

## 2020-08-11 LAB — APTT: aPTT: 32 seconds (ref 24–36)

## 2020-08-11 NOTE — Progress Notes (Addendum)
Anesthesia Review:  PCP: DR Hungerland - Christella Scheuermann   DR hungerland- 06/03/20 clearance on chart with LOV 06/02/20 on chart  Cardiologist : none 05/22/20- LOV- Dr Queen Blossom- neuro - cleasrance on chart from 2021      Chest x-ray : EKG : 08/11/20 Echo : Stress test: Cardiac Cath :  Activity level:  Can do a flight of stairs without difficulty  Sleep Study/ CPAP :none  Fasting Blood Sugar :      / Checks Blood Sugar -- times a day:   Blood Thinner/ Instructions /Last Dose: ASA / Instructions/ Last Dose :  U/A done 08/11/20 routed to DR Beane.

## 2020-08-12 NOTE — Anesthesia Preprocedure Evaluation (Addendum)
Anesthesia Evaluation  Patient identified by MRN, date of birth, ID band Patient awake    Reviewed: Allergy & Precautions, NPO status , Patient's Chart, lab work & pertinent test results, reviewed documented beta blocker date and time   Airway Mallampati: I  TM Distance: >3 FB Neck ROM: Full    Dental no notable dental hx. (+) Caps, Dental Advisory Given, Teeth Intact   Pulmonary shortness of breath and with exertion, asthma ,    Pulmonary exam normal breath sounds clear to auscultation       Cardiovascular hypertension, Pt. 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, walks with a cane  Neuromuscular disease CVA, Residual Symptoms negative psych ROS   GI/Hepatic Neg liver ROS, GERD  Medicated and Controlled,  Endo/Other  Hyperlipidemia  Renal/GU negative Renal ROS   Hx/o Prostate Ca undergoing RT    Musculoskeletal  (+) Arthritis , Osteoarthritis,  OA right knee   Abdominal (+) + obese,   Peds  Hematology negative hematology ROS (+)   Anesthesia Other Findings   Reproductive/Obstetrics                          Anesthesia Physical Anesthesia Plan  ASA: III  Anesthesia Plan: Spinal   Post-op Pain Management:  [facility] for Post-op pain   Induction:   PONV Risk Score and Plan: 2 and Treatment may vary due to age or medical condition and Ondansetron  Airway Management Planned: Natural Airway and Simple Face Mask  Additional Equipment:   Intra-op Plan:   Post-operative Plan:   Informed Consent: I have reviewed the patients History and Physical, chart, labs and discussed the procedure including the risks, benefits and alternatives for the proposed anesthesia with the patient or  authorized representative who has indicated his/her understanding and acceptance.     Dental advisory given  Plan Discussed with: CRNA and Anesthesiologist  Anesthesia Plan Comments: (PAT note written 08/12/2020 by Myra Gianotti, PA-C. He has primary care and neurology preoperative input.  Neurologist Dr. Queen Blossom also cleared patient from a neurology standpoint stating, "Doing well related to myasthenia gravis. Please be aware of medications that can worsen MG" and included MG Medication Alert form to place on hard chart.  )      Anesthesia Quick Evaluation

## 2020-08-12 NOTE — Progress Notes (Signed)
Anesthesia Chart Review:  Case: [id number] Date/Time: 08/13/20 0815   Procedure: TOTAL KNEE ARTHROPLASTY (Right Knee) - 2.5 hrs   Anesthesia type: General   Pre-op diagnosis: Right knee degenerative joint disease   Location: [facility] ROOM 04 / [facility]   Surgeons: Susa Day, MD      DISCUSSION: Patient is a 70 year old male scheduled for the above procedure.   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 BMI is consistent with obesity.  On 06/03/20, PCP Dr. Margo Aye signed a letter of surgical clearance.   Neurologist Dr. Queen Blossom also cleared patient from a neurology standpoint stating, "Doing well related to myasthenia gravis. Please be aware of medications that can worsen MG" and included MG Medication Alert form.  08/11/20 presurgical COVID-19 test negative. Anesthesia team to evaluate on the day of surgery.    VS: BP 129/77   Pulse 77   Temp 36.8 C (Oral)   Resp 16   Ht _0  (1.854 m)   Wt 106.6 kg   SpO2 99%   BMI 31.00 kg/m     PROVIDERS: [name], [name], MD is PCP ([facility] in [place], [place]; [phone]). 06/02/20 note reviewed.  Lajuana Matte, MD is neurologist ([facility]). Per notes, initial symptom onset of difficulty holding his head up followed by ptosis ~ 02/2019 and diagnosed with autoimmune MG at [facility] and started on mestinon and prednisone. Cellcept was added in 2016 due to mild exacerbation. In fall 2018, he had bulbar predominant exacerbation of myasthenia gravis, which lasted about 6 weeks with severe difficulty swallowing, even pills, which resulted in a 30 pound weight loss. He couldn't identify any triggering factors. He was put on prednisone 30 mg daily back in October or early November, and CellCept was increased from 500 mg twice a day to 1000 mg twice a day. IVIG was  discussed, but because he was starting to improve, it was not given. The prednisone resulted in significant clinical improvement. At 05/22/20 visit, "His neuroexam continues to be very stable (Cranial MMT -3 and limb MMT -0) on MMF 1260m BID. My consider reducing mycophenolate after he completes prostate cancer treatment.  -Wendie Simmer MD is urologist - MTyler Pita MD is RAD-ONC   LABS: Preoperative labs noted. PST RN routed UA results to Dr. BTonita Cong  (all labs ordered are listed, but only abnormal results are displayed)  Labs Reviewed  CBC - Abnormal; Notable for the following components:      Result Value   Platelets 138 (*)    All other components within normal limits  URINALYSIS, ROUTINE W REFLEX MICROSCOPIC - Abnormal; Notable for the following components:   Hgb urine dipstick MODERATE (*)    Protein, ur 30 (*)    Leukocytes,Ua MODERATE (*)    WBC, UA >50 (*)    Bacteria, UA MANY (*)    All other components within normal limits  SURGICAL PCR SCREEN  BASIC METABOLIC PANEL  PROTIME-INR  APTT     OTHER: Polysomnogram 03/03/09 ([facility]): INTERPRETATION: This overnight polysomnogram reveals mild obstructive sleep apnea that is worse in REM sleep.    EKG: 08/11/20:  Normal sinus rhythm Anterior infarct , age undetermined Suspect V1-V2 lead reversal Abnormal ECG Confirmed by MLoralie [phone] on 08/11/2020 5:31:02 PM   CV: Stress echo 05/23/13 ([facility]): INTERPRETATION  Normal Stress Echocardiogram  WITH MILD  LVH    Echo 05/02/13 ([facility]): INTERPRETATION  TECHNICALLY LIMITED STUDY  PRESERVED LV FUNCTION  NORMAL WALL MOTION  EF OVER 60%  NORMAL RV  NO PERICARDIAL EFFUSION  VERY MILD MAC  MILD MODERATE LAE 5.1CM  MILD CONCENTRIC LVH  MILD DIASTOLIC DYSFUNCTION    Past Medical History:  Diagnosis Date  . Allergy   . Arthritis   . Asthma   . Cataract    bilateral - just watching  . GERD (gastroesophageal reflux disease)   . Hemorrhoids   .  Hypertension   . MG (myasthenia gravis) ([place])   . Prostate cancer ([place])   . Stroke [facility])    10/05-2018- slight right     Past Surgical History:  Procedure Laterality Date  . COLONOSCOPY  2013   polyps  . greenlight procedure  2013  . KNEE ARTHROSCOPY Right   . PROSTATE BIOPSY    . right foot surgery    . TONSILLECTOMY    . WISDOM TOOTH EXTRACTION      MEDICATIONS: . albuterol (VENTOLIN HFA) 108 (90 Base) MCG/ACT inhaler  . aspirin 81 MG EC tablet  . Calcium Carb-Cholecalciferol (CALCIUM + D3 PO)  . finasteride (PROSCAR) 5 MG tablet  . mycophenolate (CELLCEPT) 500 MG tablet  . potassium chloride SA (KLOR-CON) 20 MEQ tablet  . rosuvastatin (CRESTOR) 40 MG tablet  . tamsulosin (FLOMAX) 0.4 MG CAPS capsule  . valsartan-hydrochlorothiazide (DIOVAN-HCT) 160-25 MG tablet   No current facility-administered medications for this encounter.    Myra Gianotti, PA-C Surgical Short Stay/Anesthesiology [facility] Phone [phone] [facility] Phone [phone] 08/12/2020 11:37 AM

## 2020-08-13 ENCOUNTER — Other Ambulatory Visit: Payer: Self-pay

## 2020-08-13 ENCOUNTER — Inpatient Hospital Stay (HOSPITAL_COMMUNITY)
Admission: RE | Admit: 2020-08-13 | Discharge: 2020-08-15 | DRG: 470 | Disposition: A | Discharge status: Home Health | Payer: Medicare Other | Attending: Specialist | Admitting: Specialist

## 2020-08-13 ENCOUNTER — Inpatient Hospital Stay (HOSPITAL_COMMUNITY): Payer: Medicare Other | Admitting: Anesthesiology

## 2020-08-13 ENCOUNTER — Inpatient Hospital Stay (HOSPITAL_COMMUNITY): Payer: Medicare Other

## 2020-08-13 ENCOUNTER — Inpatient Hospital Stay (HOSPITAL_COMMUNITY): Payer: Medicare Other | Admitting: Vascular Surgery

## 2020-08-13 ENCOUNTER — Encounter (HOSPITAL_COMMUNITY): Payer: Self-pay | Admitting: Specialist

## 2020-08-13 ENCOUNTER — Encounter (HOSPITAL_COMMUNITY)
Admission: RE | Disposition: A | Discharge status: Home / Self Care | Payer: Self-pay | Source: Home / Self Care | Attending: Specialist

## 2020-08-13 DIAGNOSIS — Z96659 Presence of unspecified artificial knee joint: Secondary | ICD-10-CM

## 2020-08-13 DIAGNOSIS — Z8042 Family history of malignant neoplasm of prostate: Secondary | ICD-10-CM | POA: Diagnosis not present

## 2020-08-13 DIAGNOSIS — K219 Gastro-esophageal reflux disease without esophagitis: Secondary | ICD-10-CM | POA: Diagnosis present

## 2020-08-13 DIAGNOSIS — Z8673 Personal history of transient ischemic attack (TIA), and cerebral infarction without residual deficits: Secondary | ICD-10-CM | POA: Diagnosis not present

## 2020-08-13 DIAGNOSIS — E669 Obesity, unspecified: Secondary | ICD-10-CM | POA: Diagnosis present

## 2020-08-13 DIAGNOSIS — E785 Hyperlipidemia, unspecified: Secondary | ICD-10-CM | POA: Diagnosis present

## 2020-08-13 DIAGNOSIS — G2581 Restless legs syndrome: Secondary | ICD-10-CM | POA: Diagnosis present

## 2020-08-13 DIAGNOSIS — Z7982 Long term (current) use of aspirin: Secondary | ICD-10-CM

## 2020-08-13 DIAGNOSIS — Z6831 Body mass index (BMI) 31.0-31.9, adult: Secondary | ICD-10-CM | POA: Diagnosis not present

## 2020-08-13 DIAGNOSIS — Z20822 Contact with and (suspected) exposure to covid-19: Secondary | ICD-10-CM | POA: Diagnosis present

## 2020-08-13 DIAGNOSIS — Z79899 Other long term (current) drug therapy: Secondary | ICD-10-CM

## 2020-08-13 DIAGNOSIS — I10 Essential (primary) hypertension: Secondary | ICD-10-CM | POA: Diagnosis present

## 2020-08-13 DIAGNOSIS — M2341 Loose body in knee, right knee: Secondary | ICD-10-CM | POA: Diagnosis present

## 2020-08-13 DIAGNOSIS — M25761 Osteophyte, right knee: Secondary | ICD-10-CM | POA: Diagnosis present

## 2020-08-13 DIAGNOSIS — Z8546 Personal history of malignant neoplasm of prostate: Secondary | ICD-10-CM

## 2020-08-13 DIAGNOSIS — Z8269 Family history of other diseases of the musculoskeletal system and connective tissue: Secondary | ICD-10-CM

## 2020-08-13 DIAGNOSIS — M1711 Unilateral primary osteoarthritis, right knee: Principal | ICD-10-CM | POA: Diagnosis present

## 2020-08-13 DIAGNOSIS — G7000 Myasthenia gravis without (acute) exacerbation: Secondary | ICD-10-CM | POA: Diagnosis present

## 2020-08-13 DIAGNOSIS — M21161 Varus deformity, not elsewhere classified, right knee: Secondary | ICD-10-CM | POA: Diagnosis present

## 2020-08-13 DIAGNOSIS — J45909 Unspecified asthma, uncomplicated: Secondary | ICD-10-CM | POA: Diagnosis present

## 2020-08-13 HISTORY — PX: TOTAL KNEE ARTHROPLASTY: SHX125

## 2020-08-13 SURGERY — ARTHROPLASTY, KNEE, TOTAL
Anesthesia: Spinal | Site: Knee | Laterality: Right

## 2020-08-13 MED ORDER — LACTATED RINGERS IV SOLN: INTRAVENOUS | Status: DC

## 2020-08-13 MED ORDER — ALBUTEROL SULFATE HFA 108 (90 BASE) MCG/ACT IN AERS: 1.0000 | INHALATION_SPRAY | Freq: Four times a day (QID) | RESPIRATORY_TRACT | Status: DC | PRN

## 2020-08-13 MED ORDER — MORPHINE SULFATE (PF) 4 MG/ML IV SOLN
0.5000 mg | INTRAVENOUS | Status: DC | PRN
  Administered 2020-08-13 (×2): 1 mg via INTRAVENOUS
  Filled 2020-08-13 (×2): qty 1

## 2020-08-13 MED ORDER — SODIUM CHLORIDE (PF) 0.9 % IJ SOLN
INTRAMUSCULAR | Status: AC
  Filled 2020-08-13: qty 20

## 2020-08-13 MED ORDER — MIDAZOLAM HCL 2 MG/2ML IJ SOLN
INTRAMUSCULAR | Status: AC
  Filled 2020-08-13: qty 2

## 2020-08-13 MED ORDER — CEFAZOLIN SODIUM-DEXTROSE 2-4 GM/100ML-% IV SOLN
2.0000 g | INTRAVENOUS | Status: AC
  Administered 2020-08-13: 2 g via INTRAVENOUS
  Filled 2020-08-13: qty 100

## 2020-08-13 MED ORDER — HYDROCODONE-ACETAMINOPHEN 5-325 MG PO TABS: 1.0000 | ORAL_TABLET | ORAL | 0 refills | Status: DC | PRN

## 2020-08-13 MED ORDER — TAMSULOSIN HCL 0.4 MG PO CAPS: 0.4000 mg | ORAL_CAPSULE | Freq: Every day | ORAL | Status: DC

## 2020-08-13 MED ORDER — VALSARTAN-HYDROCHLOROTHIAZIDE 160-25 MG PO TABS: 0.5000 | ORAL_TABLET | Freq: Every morning | ORAL | Status: DC

## 2020-08-13 MED ORDER — SODIUM CHLORIDE 0.9 % IR SOLN
Status: DC | PRN
  Administered 2020-08-13: 1000 mL

## 2020-08-13 MED ORDER — POLYETHYLENE GLYCOL 3350 17 G PO PACK: 17.0000 g | PACK | Freq: Every day | ORAL | Status: DC | PRN

## 2020-08-13 MED ORDER — ACETAMINOPHEN 500 MG PO TABS
500.0000 mg | ORAL_TABLET | Freq: Four times a day (QID) | ORAL | Status: AC
  Administered 2020-08-13 (×2): 500 mg via ORAL
  Filled 2020-08-13 (×2): qty 1

## 2020-08-13 MED ORDER — MAGNESIUM CITRATE PO SOLN: 1.0000 | Freq: Once | ORAL | Status: DC | PRN

## 2020-08-13 MED ORDER — MIDAZOLAM HCL 5 MG/5ML IJ SOLN
INTRAMUSCULAR | Status: DC | PRN
  Administered 2020-08-13: 1 mg via INTRAVENOUS

## 2020-08-13 MED ORDER — PROPOFOL 10 MG/ML IV BOLUS
INTRAVENOUS | Status: AC
  Filled 2020-08-13: qty 20

## 2020-08-13 MED ORDER — MYCOPHENOLATE MOFETIL 500 MG PO TABS: 1000.0000 mg | ORAL_TABLET | ORAL | Status: DC

## 2020-08-13 MED ORDER — ACETAMINOPHEN 325 MG PO TABS: 325.0000 mg | ORAL_TABLET | Freq: Four times a day (QID) | ORAL | Status: DC | PRN

## 2020-08-13 MED ORDER — POLYETHYLENE GLYCOL 3350 17 G PO PACK: 17.0000 g | PACK | Freq: Every day | ORAL | 0 refills | Status: DC

## 2020-08-13 MED ORDER — TRANEXAMIC ACID-NACL 1000-0.7 MG/100ML-% IV SOLN
1000.0000 mg | INTRAVENOUS | Status: AC
  Administered 2020-08-13: 1000 mg via INTRAVENOUS
  Filled 2020-08-13: qty 100

## 2020-08-13 MED ORDER — ONDANSETRON HCL 4 MG/2ML IJ SOLN: 4.0000 mg | Freq: Once | INTRAMUSCULAR | Status: DC | PRN

## 2020-08-13 MED ORDER — BUPIVACAINE LIPOSOME 1.3 % IJ SUSP
20.0000 mL | Freq: Once | INTRAMUSCULAR | Status: AC
  Administered 2020-08-13: 20 mL
  Filled 2020-08-13: qty 20

## 2020-08-13 MED ORDER — ASPIRIN 81 MG PO CHEW
81.0000 mg | CHEWABLE_TABLET | Freq: Two times a day (BID) | ORAL | Status: DC
  Administered 2020-08-14 – 2020-08-15 (×3): 81 mg via ORAL
  Filled 2020-08-13 (×3): qty 1

## 2020-08-13 MED ORDER — RISAQUAD PO CAPS
1.0000 | ORAL_CAPSULE | Freq: Every day | ORAL | Status: DC
  Administered 2020-08-13 – 2020-08-15 (×3): 1 via ORAL
  Filled 2020-08-13 (×3): qty 1

## 2020-08-13 MED ORDER — METHOCARBAMOL 500 MG PO TABS
500.0000 mg | ORAL_TABLET | Freq: Four times a day (QID) | ORAL | Status: DC | PRN
  Administered 2020-08-13 – 2020-08-14 (×2): 500 mg via ORAL
  Filled 2020-08-13 (×3): qty 1

## 2020-08-13 MED ORDER — PHENOL 1.4 % MT LIQD: 1.0000 | OROMUCOSAL | Status: DC | PRN

## 2020-08-13 MED ORDER — ONDANSETRON HCL 4 MG/2ML IJ SOLN: 4.0000 mg | Freq: Four times a day (QID) | INTRAMUSCULAR | Status: DC | PRN

## 2020-08-13 MED ORDER — BUPIVACAINE-EPINEPHRINE 0.25% -1:200000 IJ SOLN
INTRAMUSCULAR | Status: DC | PRN
  Administered 2020-08-13: 30 mL

## 2020-08-13 MED ORDER — ASPIRIN EC 81 MG PO TBEC
81.0000 mg | DELAYED_RELEASE_TABLET | Freq: Two times a day (BID) | ORAL | 1 refills | Status: AC
Start: 2020-08-13 — End: ?

## 2020-08-13 MED ORDER — SODIUM CHLORIDE 0.9% FLUSH
INTRAVENOUS | Status: DC | PRN
  Administered 2020-08-13: 20 mL

## 2020-08-13 MED ORDER — DOCUSATE SODIUM 100 MG PO CAPS
100.0000 mg | ORAL_CAPSULE | Freq: Two times a day (BID) | ORAL | Status: DC
  Administered 2020-08-13 – 2020-08-15 (×4): 100 mg via ORAL
  Filled 2020-08-13 (×4): qty 1

## 2020-08-13 MED ORDER — IRBESARTAN 75 MG PO TABS
75.0000 mg | ORAL_TABLET | Freq: Every day | ORAL | Status: DC
  Administered 2020-08-14 – 2020-08-15 (×2): 75 mg via ORAL
  Filled 2020-08-13 (×3): qty 1

## 2020-08-13 MED ORDER — HYDROCHLOROTHIAZIDE 12.5 MG PO CAPS
12.5000 mg | ORAL_CAPSULE | Freq: Every day | ORAL | Status: DC
  Administered 2020-08-14 – 2020-08-15 (×2): 12.5 mg via ORAL
  Filled 2020-08-13 (×2): qty 1

## 2020-08-13 MED ORDER — POTASSIUM CHLORIDE CRYS ER 20 MEQ PO TBCR
20.0000 meq | EXTENDED_RELEASE_TABLET | Freq: Every day | ORAL | Status: DC
  Administered 2020-08-14 – 2020-08-15 (×2): 20 meq via ORAL
  Filled 2020-08-13 (×2): qty 1

## 2020-08-13 MED ORDER — IRRISEPT - 450ML BOTTLE WITH 0.05% CHG IN STERILE WATER, USP 99.95% OPTIME
TOPICAL | Status: DC | PRN
  Administered 2020-08-13: 200 mL

## 2020-08-13 MED ORDER — HYDROCODONE-ACETAMINOPHEN 7.5-325 MG PO TABS
1.0000 | ORAL_TABLET | ORAL | Status: DC | PRN
  Administered 2020-08-13 – 2020-08-15 (×6): 2 via ORAL
  Filled 2020-08-13 (×6): qty 2

## 2020-08-13 MED ORDER — ORAL CARE MOUTH RINSE
15.0000 mL | Freq: Once | OROMUCOSAL | Status: AC
  Administered 2020-08-13: 15 mL via OROMUCOSAL

## 2020-08-13 MED ORDER — METOCLOPRAMIDE HCL 5 MG/ML IJ SOLN: 5.0000 mg | Freq: Three times a day (TID) | INTRAMUSCULAR | Status: DC | PRN

## 2020-08-13 MED ORDER — PROPOFOL 500 MG/50ML IV EMUL
INTRAVENOUS | Status: DC | PRN
  Administered 2020-08-13: 75 ug/kg/min via INTRAVENOUS

## 2020-08-13 MED ORDER — ALUM & MAG HYDROXIDE-SIMETH 200-200-20 MG/5ML PO SUSP: 30.0000 mL | ORAL | Status: DC | PRN

## 2020-08-13 MED ORDER — FENTANYL CITRATE (PF) 100 MCG/2ML IJ SOLN
INTRAMUSCULAR | Status: DC | PRN
  Administered 2020-08-13 (×2): 25 ug via INTRAVENOUS
  Administered 2020-08-13: 50 ug via INTRAVENOUS
  Administered 2020-08-13: 25 ug via INTRAVENOUS

## 2020-08-13 MED ORDER — MENTHOL 3 MG MT LOZG: 1.0000 | LOZENGE | OROMUCOSAL | Status: DC | PRN

## 2020-08-13 MED ORDER — BUPIVACAINE IN DEXTROSE 0.75-8.25 % IT SOLN
INTRATHECAL | Status: DC | PRN
  Administered 2020-08-13: 2 mL via INTRATHECAL

## 2020-08-13 MED ORDER — ONDANSETRON HCL 4 MG PO TABS: 4.0000 mg | ORAL_TABLET | Freq: Four times a day (QID) | ORAL | Status: DC | PRN

## 2020-08-13 MED ORDER — METHOCARBAMOL 500 MG IVPB - SIMPLE MED
500.0000 mg | Freq: Four times a day (QID) | INTRAVENOUS | Status: DC | PRN
  Filled 2020-08-13: qty 50

## 2020-08-13 MED ORDER — PROPOFOL 500 MG/50ML IV EMUL
INTRAVENOUS | Status: DC | PRN
  Administered 2020-08-13 (×2): 20 mg via INTRAVENOUS
  Administered 2020-08-13: 30 mg via INTRAVENOUS
  Administered 2020-08-13 (×3): 20 mg via INTRAVENOUS

## 2020-08-13 MED ORDER — CHLORHEXIDINE GLUCONATE 0.12 % MT SOLN: 15.0000 mL | Freq: Once | OROMUCOSAL | Status: AC

## 2020-08-13 MED ORDER — FENTANYL CITRATE (PF) 100 MCG/2ML IJ SOLN: 25.0000 ug | INTRAMUSCULAR | Status: DC | PRN

## 2020-08-13 MED ORDER — 0.9 % SODIUM CHLORIDE (POUR BTL) OPTIME
TOPICAL | Status: DC | PRN
  Administered 2020-08-13: 300 mL

## 2020-08-13 MED ORDER — DOCUSATE SODIUM 100 MG PO CAPS: 100.0000 mg | ORAL_CAPSULE | Freq: Two times a day (BID) | ORAL | 1 refills | Status: DC | PRN

## 2020-08-13 MED ORDER — TAMSULOSIN HCL 0.4 MG PO CAPS
0.4000 mg | ORAL_CAPSULE | Freq: Every day | ORAL | Status: DC
  Administered 2020-08-14 – 2020-08-15 (×2): 0.4 mg via ORAL
  Filled 2020-08-13 (×2): qty 1

## 2020-08-13 MED ORDER — BUPIVACAINE-EPINEPHRINE (PF) 0.25% -1:200000 IJ SOLN
INTRAMUSCULAR | Status: AC
  Filled 2020-08-13: qty 30

## 2020-08-13 MED ORDER — BISACODYL 5 MG PO TBEC: 5.0000 mg | DELAYED_RELEASE_TABLET | Freq: Every day | ORAL | Status: DC | PRN

## 2020-08-13 MED ORDER — ACETAMINOPHEN 10 MG/ML IV SOLN
1000.0000 mg | INTRAVENOUS | Status: AC
  Administered 2020-08-13: 1000 mg via INTRAVENOUS
  Filled 2020-08-13: qty 100

## 2020-08-13 MED ORDER — CEFAZOLIN SODIUM-DEXTROSE 2-4 GM/100ML-% IV SOLN
2.0000 g | Freq: Four times a day (QID) | INTRAVENOUS | Status: AC
  Administered 2020-08-13 (×2): 2 g via INTRAVENOUS
  Filled 2020-08-13 (×2): qty 100

## 2020-08-13 MED ORDER — ROPIVACAINE HCL 7.5 MG/ML IJ SOLN
INTRAMUSCULAR | Status: DC | PRN
  Administered 2020-08-13: 20 mL via PERINEURAL

## 2020-08-13 MED ORDER — MYCOPHENOLATE MOFETIL 250 MG PO CAPS
1000.0000 mg | ORAL_CAPSULE | Freq: Every day | ORAL | Status: DC
  Administered 2020-08-13 – 2020-08-14 (×2): 1000 mg via ORAL
  Filled 2020-08-13 (×2): qty 4

## 2020-08-13 MED ORDER — FINASTERIDE 5 MG PO TABS
5.0000 mg | ORAL_TABLET | Freq: Every day | ORAL | Status: DC
  Administered 2020-08-14 – 2020-08-15 (×2): 5 mg via ORAL
  Filled 2020-08-13 (×2): qty 1

## 2020-08-13 MED ORDER — MYCOPHENOLATE MOFETIL 250 MG PO CAPS
1500.0000 mg | ORAL_CAPSULE | Freq: Every day | ORAL | Status: DC
  Administered 2020-08-14 – 2020-08-15 (×2): 1500 mg via ORAL
  Filled 2020-08-13 (×2): qty 6

## 2020-08-13 MED ORDER — HYDROCODONE-ACETAMINOPHEN 5-325 MG PO TABS
1.0000 | ORAL_TABLET | ORAL | Status: DC | PRN
  Administered 2020-08-14 – 2020-08-15 (×4): 2 via ORAL
  Filled 2020-08-13 (×4): qty 2

## 2020-08-13 MED ORDER — METOCLOPRAMIDE HCL 5 MG PO TABS: 5.0000 mg | ORAL_TABLET | Freq: Three times a day (TID) | ORAL | Status: DC | PRN

## 2020-08-13 MED ORDER — FENTANYL CITRATE (PF) 100 MCG/2ML IJ SOLN
INTRAMUSCULAR | Status: AC
  Filled 2020-08-13: qty 2

## 2020-08-13 MED ORDER — PROPOFOL 500 MG/50ML IV EMUL
INTRAVENOUS | Status: AC
  Filled 2020-08-13: qty 50

## 2020-08-13 MED ORDER — DIPHENHYDRAMINE HCL 12.5 MG/5ML PO ELIX
12.5000 mg | ORAL_SOLUTION | ORAL | Status: DC | PRN
Start: 2020-08-13 — End: 2020-08-15

## 2020-08-13 MED ORDER — KCL IN DEXTROSE-NACL 20-5-0.45 MEQ/L-%-% IV SOLN
INTRAVENOUS | Status: AC
  Filled 2020-08-13 (×2): qty 1000

## 2020-08-13 SURGICAL SUPPLY — 77 items
ATTUNE MED DOME PAT 41 KNEE (Knees) ×2 IMPLANT
ATTUNE PS FEM RT SZ9 CEM KNEE (Femur) ×2 IMPLANT
BAG DECANTER FOR FLEXI CONT (MISCELLANEOUS) IMPLANT
BAG ZIPLOCK 12X15 (MISCELLANEOUS) ×2 IMPLANT
BASE TIBIAL ATTUNE KNEE SZ9 (Knees) ×1 IMPLANT
BLADE SAW SGTL 11.0X1.19X90.0M (BLADE) ×2 IMPLANT
BLADE SAW SGTL 13.0X1.19X90.0M (BLADE) ×2 IMPLANT
BLADE SURG SZ10 CARB STEEL (BLADE) ×4 IMPLANT
BNDG COHESIVE 4X5 TAN STRL (GAUZE/BANDAGES/DRESSINGS) ×2 IMPLANT
BNDG ELASTIC 4X5.8 VLCR STR LF (GAUZE/BANDAGES/DRESSINGS) ×2 IMPLANT
BNDG ELASTIC 6X5.8 VLCR STR LF (GAUZE/BANDAGES/DRESSINGS) ×2 IMPLANT
CEMENT HV SMART SET (Cement) ×4 IMPLANT
COVER SURGICAL LIGHT HANDLE (MISCELLANEOUS) ×2 IMPLANT
COVER WAND RF STERILE (DRAPES) IMPLANT
CUFF TOURN SGL QUICK 34 (TOURNIQUET CUFF) ×1
CUFF TRNQT CYL 34X4.125X (TOURNIQUET CUFF) ×1 IMPLANT
DECANTER SPIKE VIAL GLASS SM (MISCELLANEOUS) ×2 IMPLANT
DRAPE INCISE IOBAN 66X45 STRL (DRAPES) IMPLANT
DRAPE ORTHO SPLIT 77X108 STRL (DRAPES) ×2
DRAPE SHEET LG 3/4 BI-LAMINATE (DRAPES) ×6 IMPLANT
DRAPE SURG ORHT 6 SPLT 77X108 (DRAPES) ×2 IMPLANT
DRAPE U-SHAPE 47X51 STRL (DRAPES) ×2 IMPLANT
DRSG AQUACEL AG ADV 3.5X10 (GAUZE/BANDAGES/DRESSINGS) ×2 IMPLANT
DRSG TEGADERM 4X4.75 (GAUZE/BANDAGES/DRESSINGS) IMPLANT
DURAPREP 26ML APPLICATOR (WOUND CARE) ×2 IMPLANT
ELECT BLADE TIP CTD 4 INCH (ELECTRODE) ×2 IMPLANT
ELECT REM PT RETURN 15FT ADLT (MISCELLANEOUS) ×4 IMPLANT
EVACUATOR 1/8 PVC DRAIN (DRAIN) IMPLANT
GAUZE SPONGE 2X2 8PLY STRL LF (GAUZE/BANDAGES/DRESSINGS) IMPLANT
GLOVE SRG 8 PF TXTR STRL LF DI (GLOVE) ×1 IMPLANT
GLOVE SURG POLYISO LF SZ7.5 (GLOVE) ×4 IMPLANT
GLOVE SURG POLYISO LF SZ8 (GLOVE) ×4 IMPLANT
GLOVE SURG UNDER POLY LF SZ7.5 (GLOVE) ×2 IMPLANT
GLOVE SURG UNDER POLY LF SZ8 (GLOVE) ×1
GOWN STRL REUS W/TWL XL LVL3 (GOWN DISPOSABLE) ×8 IMPLANT
HANDPIECE INTERPULSE COAX TIP (DISPOSABLE) ×1
HEMOSTAT SPONGE AVITENE ULTRA (HEMOSTASIS) IMPLANT
HOLDER FOLEY CATH W/STRAP (MISCELLANEOUS) ×2 IMPLANT
IMMOBILIZER KNEE 20 (SOFTGOODS) ×2
IMMOBILIZER KNEE 20 THIGH 36 (SOFTGOODS) ×1 IMPLANT
INSERT TIBIAL ATTUNE SZ9 5MM (Insert) ×2 IMPLANT
JET LAVAGE IRRISEPT WOUND (IRRIGATION / IRRIGATOR) ×2
KIT TURNOVER KIT A (KITS) ×2 IMPLANT
LAVAGE JET IRRISEPT WOUND (IRRIGATION / IRRIGATOR) ×1 IMPLANT
MANIFOLD NEPTUNE II (INSTRUMENTS) ×2 IMPLANT
NDL SAFETY ECLIPSE 18X1.5 (NEEDLE) ×1 IMPLANT
NEEDLE HYPO 18GX1.5 SHARP (NEEDLE) ×1
NS IRRIG 1000ML POUR BTL (IV SOLUTION) ×2 IMPLANT
PACK TOTAL KNEE CUSTOM (KITS) ×2 IMPLANT
PIN FIX SIGMA LCS THRD HI (PIN) ×2 IMPLANT
PIN STEINMAN FIXATION KNEE (PIN) ×2 IMPLANT
PROTECTOR NERVE ULNAR (MISCELLANEOUS) ×2 IMPLANT
SAW OSC TIP CART 19.5X105X1.3 (SAW) ×2 IMPLANT
SEALER BIPOLAR AQUA 6.0 (INSTRUMENTS) ×2 IMPLANT
SET HNDPC FAN SPRY TIP SCT (DISPOSABLE) ×1 IMPLANT
SPONGE GAUZE 2X2 STER 10/PKG (GAUZE/BANDAGES/DRESSINGS)
SPONGE SURGIFOAM ABS GEL 100 (HEMOSTASIS) IMPLANT
STAPLER VISISTAT (STAPLE) ×2 IMPLANT
STRIP CLOSURE SKIN 1/2X4 (GAUZE/BANDAGES/DRESSINGS) ×2 IMPLANT
SUT BONE WAX W31G (SUTURE) ×2 IMPLANT
SUT MNCRL AB 4-0 PS2 18 (SUTURE) IMPLANT
SUT STRATAFIX 0 PDS 27 VIOLET (SUTURE) ×2
SUT VIC AB 1 CT1 27 (SUTURE) ×3
SUT VIC AB 1 CT1 27XBRD ANTBC (SUTURE) ×3 IMPLANT
SUT VIC AB 1 CTX 36 (SUTURE)
SUT VIC AB 1 CTX36XBRD ANBCTR (SUTURE) IMPLANT
SUT VIC AB 2-0 CT1 27 (SUTURE) ×3
SUT VIC AB 2-0 CT1 TAPERPNT 27 (SUTURE) ×3 IMPLANT
SUTURE STRATFX 0 PDS 27 VIOLET (SUTURE) ×1 IMPLANT
SYR 3ML LL SCALE MARK (SYRINGE) IMPLANT
SYR 50ML LL SCALE MARK (SYRINGE) IMPLANT
TIBIAL BASE ATTUNE KNEE SZ9 (Knees) ×2 IMPLANT
TOWER CARTRIDGE SMART MIX (DISPOSABLE) ×2 IMPLANT
TRAY FOLEY MTR SLVR 16FR STAT (SET/KITS/TRAYS/PACK) ×2 IMPLANT
WATER STERILE IRR 1000ML POUR (IV SOLUTION) ×4 IMPLANT
WIPE CHG CHLORHEXIDINE 2% (PERSONAL CARE ITEMS) ×2 IMPLANT
WRAP KNEE MAXI GEL POST OP (GAUZE/BANDAGES/DRESSINGS) ×2 IMPLANT

## 2020-08-13 NOTE — Anesthesia Procedure Notes (Signed)
Anesthesia Regional Block: Adductor canal block   Pre-Anesthetic Checklist: ,, timeout performed, Correct Patient, Correct Site, Correct Laterality, Correct Procedure, Correct Position, site marked, Risks and benefits discussed,  Surgical consent,  Pre-op evaluation,  At surgeon's request and post-op pain management  Laterality: Right  Prep: chloraprep       Needles:  Injection technique: Single-shot  Needle Type: Echogenic Stimulator Needle     Needle Length: 9cm  Needle Gauge: 21   Needle insertion depth: 6 cm   Additional Needles:   Procedures:,,,, ultrasound used (permanent image in chart),,,,  Narrative:  Start time: 08/13/2020 8:10 AM End time: 08/13/2020 8:15 AM Injection made incrementally with aspirations every 5 mL.  Performed by: Personally  Anesthesiologist: Josephine Igo, MD  Additional Notes: Timeout performed. Patient sedated. Relevant anatomy ID'd using [place]. Incremental 2-57ml injection of LA with frequent aspiration. Patient tolerated procedure well.        Right Adductor Canal Block

## 2020-08-13 NOTE — Anesthesia Postprocedure Evaluation (Signed)
Anesthesia Post Note  Patient: Vincent Liu  Procedure(s) Performed: TOTAL KNEE ARTHROPLASTY (Right Knee)     Patient location during evaluation: PACU Anesthesia Type: Spinal Level of consciousness: oriented and awake and alert Pain management: pain level controlled Vital Signs Assessment: post-procedure vital signs reviewed and stable Respiratory status: spontaneous breathing, respiratory function stable and nonlabored ventilation Cardiovascular status: blood pressure returned to baseline and stable Postop Assessment: no headache, no backache, no apparent nausea or vomiting, spinal receding and patient able to bend at knees Anesthetic complications: no   No complications documented.  Last Vitals:  Vitals:   08/13/20 1215 08/13/20 1230  BP: 112/72 112/70  Pulse: 61 (!) 55  Resp: 15 12  Temp: 36.7 C (!) 36.4 C  SpO2: 100% 99%    Last Pain:  Vitals:   08/13/20 1230  TempSrc:   PainSc: 0-No pain                 [name] A.

## 2020-08-13 NOTE — Anesthesia Procedure Notes (Signed)
Spinal  Patient location during procedure: OR Start time: 08/13/2020 8:35 AM End time: 08/13/2020 8:40 AM Reason for block: surgical anesthesia Staffing Performed: resident/CRNA  Anesthesiologist: Josephine Igo, MD Resident/CRNA: Gwyndolyn Saxon, CRNA Preanesthetic Checklist Completed: patient identified, IV checked, site marked, risks and benefits discussed, surgical consent, monitors and equipment checked, pre-op evaluation and timeout performed Spinal Block Patient position: sitting Prep: DuraPrep Patient monitoring: heart rate, continuous pulse ox and blood pressure Approach: midline Location: L3-4 Injection technique: single-shot Needle Needle type: Pencan  Needle gauge: 24 G Needle length: 10 cm Assessment Sensory level: T8 Events: CSF return

## 2020-08-13 NOTE — H&P (Signed)
Vincent Liu is an 70 y.o. male.   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  Presents for total knee replacement after failing conservative treatment.  Past Medical History:  Diagnosis Date  . Allergy   . Arthritis   . Asthma   . Cataract    bilateral - just watching  . GERD (gastroesophageal reflux disease)   . Hemorrhoids   . Hypertension   . MG (myasthenia gravis) ([place])   . Prostate cancer ([place])   . Stroke [facility])    10/05-2018- slight right     Past Surgical History:  Procedure Laterality Date  . COLONOSCOPY  2013   polyps  . greenlight procedure  2013  . KNEE ARTHROSCOPY Right   . PROSTATE BIOPSY    . right foot surgery    . TONSILLECTOMY    . WISDOM TOOTH EXTRACTION      Family History  Problem Relation Age of Onset  . Myasthenia gravis Mother   . Prostate cancer Father   . Myasthenia gravis Maternal Grandmother   . Colon cancer Neg Hx   . Rectal cancer Neg Hx   . Stomach cancer Neg Hx    Social History:  reports that he has never smoked. He has never used smokeless tobacco. He reports that he does not drink alcohol and does not use drugs.  Allergies: No Known Allergies  Medications Prior to Admission  Medication Sig Dispense Refill  . aspirin 81 MG EC tablet Take 81 mg by mouth in the morning.    . Calcium Carb-Cholecalciferol (CALCIUM + D3 PO) Take 1 tablet by mouth in the morning.    . finasteride (PROSCAR) 5 MG tablet Take 5 mg by mouth in the morning.    . mycophenolate (CELLCEPT) 500 MG tablet Take 1,000-1,500 mg by mouth See admin instructions. Take 3 tablets (1500 mg) by mouth in the morning & take 2 tablets (1000 mg) by mouth in the evening    . potassium chloride SA (KLOR-CON) 20 MEQ tablet Take 20 mEq by mouth in the morning.    . rosuvastatin (CRESTOR) 40 MG tablet Take 40 mg by mouth daily.    . tamsulosin (FLOMAX) 0.4 MG CAPS  capsule Take 1 capsule (0.4 mg total) by mouth daily. (Patient taking differently: Take 0.4 mg by mouth in the morning.) 30 capsule 3  . valsartan-hydrochlorothiazide (DIOVAN-HCT) 160-25 MG tablet Take 0.5 tablets by mouth in the morning.    Marland Kitchen albuterol (VENTOLIN HFA) 108 (90 Base) MCG/ACT inhaler Inhale 1-2 puffs into the lungs every 6 (six) hours as needed for wheezing or shortness of breath.      Results for orders placed or performed during the hospital encounter of 08/11/20 (from the past 48 hour(s))  SARS CORONAVIRUS 2 (TAT 6-24 HRS) Nasopharyngeal Nasopharyngeal Swab     Status: None   Collection Time: 08/11/20 11:13 AM   Specimen: Nasopharyngeal Swab  Result Value Ref Range   SARS Coronavirus 2 NEGATIVE NEGATIVE    Comment: (NOTE) SARS-CoV-2 target nucleic acids are NOT DETECTED.  The SARS-CoV-2 RNA is generally detectable in upper and lower respiratory specimens during the acute phase of infection. Negative results do not preclude SARS-CoV-2 infection, do not rule out co-infections with other pathogens, and should not be used as the sole basis for treatment or other patient management decisions. Negative results must be combined with clinical observations, patient history, and epidemiological information. The  expected result is Negative.  Fact Sheet for Patients: SugarRoll.be  Fact Sheet for Healthcare Providers: https://www.woods-mathews.com/  This test is not yet approved or cleared by the [place] FDA and  has been authorized for detection and/or diagnosis of SARS-CoV-2 by FDA under an Emergency Use Authorization (EUA). This EUA will remain  in effect (meaning this test can be used) for the duration of the COVID-19 declaration under Se ction 564(b)(1) of the Act, 21 U.S.C. section 360bbb-3(b)(1), unless the authorization is terminated or revoked sooner.  Performed at [facility], [place] [address], [place],   [address]    No results found.  Review of Systems  Musculoskeletal: Positive for arthralgias, gait problem and joint swelling.  All other systems reviewed and are negative.   Blood pressure (!) 130/92, pulse 72, temperature 98 F (36.7 C), temperature source Oral, resp. rate 18, SpO2 97 %. Physical Exam HENT:     Head: Normocephalic.  Eyes:     Pupils: Pupils are equal, round, and reactive to light.  Cardiovascular:     Rate and Rhythm: Normal rate.  Pulmonary:     Effort: Pulmonary effort is normal.  Abdominal:     General: Abdomen is flat.     Palpations: Abdomen is soft.  Musculoskeletal:        General: Swelling and deformity present.     Cervical back: Normal range of motion.     Right lower leg: Edema present.     Comments: ROM -5 to 130 right. 2+ pulses  Skin:    General: Skin is warm and dry.     Capillary Refill: Capillary refill takes less than 2 seconds.  Neurological:     General: No focal deficit present.     Mental Status: He is alert.  Psychiatric:        Mood and Affect: Mood normal.        Thought Content: Thought content normal.   X-rays of the right knee demonstrates end-stage osteoarthrosis medial compartment with a varus deformity.  Degenerative changes of the lateral compartment are noted as well.  Assessment/Plan End-stage osteoarthrosis medial compartment right knee with a varus deformity Bone-on-bone arthrosis. Plan to proceed with a right total knee arthroplasty. Risk and benefits discussed including bleeding, infection, tendon damage to neurovascular structures.  Suboptimal range of motion component loosening arthrofibrosis DVT PE anesthetic complication etc.  Patient has a history of a CVA.  Has history of restless leg syndrome.  And also myasthenia gravis.  Johnn Hai, MD 08/13/2020, 8:22 AM

## 2020-08-13 NOTE — Discharge Instructions (Signed)
Elevate leg above heart 6x a day for 20minutes each Use knee immobilizer while walking until can SLR x 10 Use knee immobilizer in bed to keep knee in extension Aquacel dressing may remain in place until follow up. May shower with aquacel dressing in place. If the dressing becomes saturated or peels off, you may remove aquacel dressing. Do not remove steri-strips if they are present. Place new dressing with gauze and tape or ACE bandage which should be kept clean and dry and changed daily.  INSTRUCTIONS AFTER JOINT REPLACEMENT   o Remove items at home which could result in a fall. This includes throw rugs or furniture in walking pathways o ICE to the affected joint every three hours while awake for 30 minutes at a time, for at least the first 3-5 days, and then as needed for pain and swelling.  Continue to use ice for pain and swelling. You may notice swelling that will progress down to the foot and ankle.  This is normal after surgery.  Elevate your leg when you are not up walking on it.   o Continue to use the breathing machine you got in the hospital (incentive spirometer) which will help keep your temperature down.  It is common for your temperature to cycle up and down following surgery, especially at night when you are not up moving around and exerting yourself.  The breathing machine keeps your lungs expanded and your temperature down.   DIET:  As you were doing prior to hospitalization, we recommend a well-balanced diet.  DRESSING / WOUND CARE / SHOWERING  Keep the surgical dressing until follow up.  The dressing is water proof, so you can shower without any extra covering.  IF THE DRESSING FALLS OFF or the wound gets wet inside, change the dressing with sterile gauze.  Please use good hand washing techniques before changing the dressing.  Do not use any lotions or creams on the incision until instructed by your surgeon.    ACTIVITY  o Increase activity slowly as tolerated, but follow the  weight bearing instructions below.   o No driving for 6 weeks or until further direction given by your physician.  You cannot drive while taking narcotics.  o No lifting or carrying greater than 10 lbs. until further directed by your surgeon. o Avoid periods of inactivity such as sitting longer than an hour when not asleep. This helps prevent blood clots.  o You may return to work once you are authorized by your doctor.     WEIGHT BEARING   Weight bearing as tolerated with assist device (walker, cane, etc) as directed, use it as long as suggested by your surgeon or therapist, typically at least 4-6 weeks.   EXERCISES  Results after joint replacement surgery are often greatly improved when you follow the exercise, range of motion and muscle strengthening exercises prescribed by your doctor. Safety measures are also important to protect the joint from further injury. Any time any of these exercises cause you to have increased pain or swelling, decrease what you are doing until you are comfortable again and then slowly increase them. If you have problems or questions, call your caregiver or physical therapist for advice.   Rehabilitation is important following a joint replacement. After just a few days of immobilization, the muscles of the leg can become weakened and shrink (atrophy).  These exercises are designed to build up the tone and strength of the thigh and leg muscles and to improve motion. Often   times heat used for twenty to thirty minutes before working out will loosen up your tissues and help with improving the range of motion but do not use heat for the first two weeks following surgery (sometimes heat can increase post-operative swelling).   These exercises can be done on a training (exercise) mat, on the floor, on a table or on a bed. Use whatever works the best and is most comfortable for you.    Use music or television while you are exercising so that the exercises are a pleasant  break in your day. This will make your life better with the exercises acting as a break in your routine that you can look forward to.   Perform all exercises about fifteen times, three times per day or as directed.  You should exercise both the operative leg and the other leg as well.  Exercises include:   . Quad Sets - Tighten up the muscle on the front of the thigh (Quad) and hold for 5-10 seconds.   . Straight Leg Raises - With your knee straight (if you were given a brace, keep it on), lift the leg to 60 degrees, hold for 3 seconds, and slowly lower the leg.  Perform this exercise against resistance later as your leg gets stronger.  . Leg Slides: Lying on your back, slowly slide your foot toward your buttocks, bending your knee up off the floor (only go as far as is comfortable). Then slowly slide your foot back down until your leg is flat on the floor again.  . Angel Wings: Lying on your back spread your legs to the side as far apart as you can without causing discomfort.  . Hamstring Strength:  Lying on your back, push your heel against the floor with your leg straight by tightening up the muscles of your buttocks.  Repeat, but this time bend your knee to a comfortable angle, and push your heel against the floor.  You may put a pillow under the heel to make it more comfortable if necessary.   A rehabilitation program following joint replacement surgery can speed recovery and prevent re-injury in the future due to weakened muscles. Contact your doctor or a physical therapist for more information on knee rehabilitation.    CONSTIPATION  Constipation is defined medically as fewer than three stools per week and severe constipation as less than one stool per week.  Even if you have a regular bowel pattern at home, your normal regimen is likely to be disrupted due to multiple reasons following surgery.  Combination of anesthesia, postoperative narcotics, change in appetite and fluid intake all can  affect your bowels.   YOU MUST use at least one of the following options; they are listed in order of increasing strength to get the job done.  They are all available over the counter, and you may need to use some, POSSIBLY even all of these options:    Drink plenty of fluids (prune juice may be helpful) and high fiber foods Colace 100 mg by mouth twice a day  Senokot for constipation as directed and as needed Dulcolax (bisacodyl), take with full glass of water  Miralax (polyethylene glycol) once or twice a day as needed.  If you have tried all these things and are unable to have a bowel movement in the first 3-4 days after surgery call either your surgeon or your primary doctor.    If you experience loose stools or diarrhea, hold the medications until you stool   forms back up.  If your symptoms do not get better within 1 week or if they get worse, check with your doctor.  If you experience "the worst abdominal pain ever" or develop nausea or vomiting, please contact the office immediately for further recommendations for treatment.   ITCHING:  If you experience itching with your medications, try taking only a single pain pill, or even half a pain pill at a time.  You can also use Benadryl over the counter for itching or also to help with sleep.   TED HOSE STOCKINGS:  Use stockings on both legs until for at least 2 weeks or as directed by physician office. They may be removed at night for sleeping.  MEDICATIONS:  See your medication summary on the "After Visit Summary" that nursing will review with you.  You may have some home medications which will be placed on hold until you complete the course of blood thinner medication.  It is important for you to complete the blood thinner medication as prescribed.  PRECAUTIONS:  If you experience chest pain or shortness of breath - call 911 immediately for transfer to the hospital emergency department.   If you develop a fever greater that 101 F, purulent  drainage from wound, increased redness or drainage from wound, foul odor from the wound/dressing, or calf pain - CONTACT YOUR SURGEON.                                                   FOLLOW-UP APPOINTMENTS:  If you do not already have a post-op appointment, please call the office for an appointment to be seen by your surgeon.  Guidelines for how soon to be seen are listed in your "After Visit Summary", but are typically between 1-4 weeks after surgery.  OTHER INSTRUCTIONS:   Knee Replacement:  Do not place pillow under knee, focus on keeping the knee straight while resting. CPM instructions: 0-90 degrees, 2 hours in the morning, 2 hours in the afternoon, and 2 hours in the evening. Place foam block, curve side up under heel at all times except when in CPM or when walking.  DO NOT modify, tear, cut, or change the foam block in any way.  POST-OPERATIVE OPIOID TAPER INSTRUCTIONS: . It is important to wean off of your opioid medication as soon as possible. If you do not need pain medication after your surgery it is ok to stop day one. . Opioids include: o Codeine, Hydrocodone(Norco, Vicodin), Oxycodone(Percocet, oxycontin) and hydromorphone amongst others.  . Long term and even short term use of opiods can cause: o Increased pain response o Dependence o Constipation o Depression o Respiratory depression o And more.  . Withdrawal symptoms can include o Flu like symptoms o Nausea, vomiting o And more . Techniques to manage these symptoms o Hydrate well o Eat regular healthy meals o Stay active o Use relaxation techniques(deep breathing, meditating, yoga) . Do Not substitute Alcohol to help with tapering . If you have been on opioids for less than two weeks and do not have pain than it is ok to stop all together.  . Plan to wean off of opioids o This plan should start within one week post op of your joint replacement. o Maintain the same interval or time between taking each dose and first  decrease the dose.  o Cut   the total daily intake of opioids by one tablet each day o Next start to increase the time between doses. o The last dose that should be eliminated is the evening dose.     MAKE SURE YOU:  . Understand these instructions.  . Get help right away if you are not doing well or get worse.    Thank you for letting us be a part of your medical care team.  It is a privilege we respect greatly.  We hope these instructions will help you stay on track for a fast and full recovery!      

## 2020-08-13 NOTE — Transfer of Care (Addendum)
Immediate Anesthesia Transfer of Care Note  Patient: Vincent Liu  Procedure(s) Performed: TOTAL KNEE ARTHROPLASTY (Right Knee)  Patient Location: PACU  Anesthesia Type:MAC, Regional and Spinal  Level of Consciousness: drowsy  Airway & Oxygen Therapy: Patient Spontanous Breathing and Patient connected to face mask oxygen  Post-op Assessment: Report given to RN and Post -op Vital signs reviewed and stable  Post vital signs: Reviewed and stable  Last Vitals:  Vitals Value Taken Time  BP 103/66 08/13/20 1124  Temp    Pulse 63 08/13/20 1127  Resp 17 08/13/20 1127  SpO2 98 % 08/13/20 1127  Vitals shown include unvalidated device data.  Last Pain:  Vitals:   08/13/20 0644  TempSrc: Oral         Complications: No complications documented.

## 2020-08-13 NOTE — Brief Op Note (Signed)
08/13/2020  11:09 AM  PATIENT:  Kandis Mannan III  70 y.o. male  PRE-OPERATIVE DIAGNOSIS:  Right knee degenerative joint disease  POST-OPERATIVE DIAGNOSIS:  Right knee degenerative joint disease  PROCEDURE:  Procedure(s) with comments: TOTAL KNEE ARTHROPLASTY (Right) - 2.5 hrs  SURGEON:  Surgeon(s) and Role:    Susa Day, MD - Primary  PHYSICIAN ASSISTANT:   ASSISTANTS: Bissell   ANESTHESIA:   spinal  EBL:  50 mL   BLOOD ADMINISTERED:none  DRAINS: none   LOCAL MEDICATIONS USED:  MARCAINE     SPECIMEN:  No Specimen  DISPOSITION OF SPECIMEN:  N/A  COUNTS:  YES  TOURNIQUET:   Total Tourniquet Time Documented: Thigh (Right) - 76 minutes Total: Thigh (Right) - 76 minutes   DICTATION: .Other Dictation: Dictation Number   [id number]  PLAN OF CARE: Admit to inpatient   PATIENT DISPOSITION:  PACU - hemodynamically stable.   Delay start of Pharmacological VTE agent (>24hrs) due to surgical blood loss or risk of bleeding: no

## 2020-08-13 NOTE — Interval H&P Note (Signed)
History and Physical Interval Note:  08/13/2020 8:28 AM  Vincent Liu  has presented today for surgery, with the diagnosis of Right knee degenerative joint disease.  The various methods of treatment have been discussed with the patient and family. After consideration of risks, benefits and other options for treatment, the patient has consented to  Procedure(s) with comments: TOTAL KNEE ARTHROPLASTY (Right) - 2.5 hrs as a surgical intervention.  The patient's history has been reviewed, patient examined, no change in status, stable for surgery.  I have reviewed the patient's chart and labs.  Questions were answered to the patient's satisfaction.     Johnn Hai

## 2020-08-13 NOTE — Evaluation (Signed)
Physical Therapy Evaluation Patient Details Name: Vincent Liu MRN: [id number] DOB: 1951-03-17 Today's Date: 08/13/2020   History of Present Illness  Patient is 70 y.o. male s/p Rt TKA on 08/13/20 with PMH significant for OA, asthma, GERD, HTN, MG, Stroke.  Clinical Impression  Vincent Liu is a 70 y.o. male POD 0 s/p Rt TKA. Patient reports modified independence with SPC for mobility at baseline. Patient is now limited by functional impairments (see PT problem list below) and requires min assist for transfers and gait with RW. Patient was able to ambulate ~10 feet with RW and min assist. Patient posture flexed at mostly at cervical spine due to muscle fatigue 2/2 MG. Patient instructed in exercise to facilitate circulation to manage edema and reduce risk of DVT. Patient will benefit from continued skilled PT interventions to address impairments and progress towards PLOF. Acute PT will follow to progress mobility and stair training in preparation for safe discharge home.     Follow Up Recommendations Follow surgeon's recommendation for DC plan and follow-up therapies;Home health PT    Equipment Recommendations  None recommended by PT    Recommendations for Other Services       Precautions / Restrictions Precautions Precautions: Fall Restrictions Weight Bearing Restrictions: No Other Position/Activity Restrictions: WBAT      Mobility  Bed Mobility Overal bed mobility: Needs Assistance Bed Mobility: Supine to Sit     Supine to sit: Min assist;HOB elevated     General bed mobility comments: min cues for sequencing and use of bed rail, assist to pivot fully to EOB.    Transfers Overall transfer level: Needs assistance Equipment used: Rolling walker (2 wheeled) Transfers: Sit to/from Stand Sit to Stand: Min assist         General transfer comment: cues for technique/hand placement for power up, assist needed to initiate and steady with  rise.  Ambulation/Gait Ambulation/Gait assistance: Min assist Gait Distance (Feet): 10 Feet Assistive device: Rolling walker (2 wheeled) Gait Pattern/deviations: Step-to pattern;Decreased stride length;Decreased stance time - right;Decreased weight shift to right Gait velocity: decr   General Gait Details: cues for step pattern/proximity to RW, no overt LOB or buckling at Rt knee. pt's head flexed, reports 2/2 muscle weakness and fatigue from MG.  Stairs            Wheelchair Mobility    Modified Rankin (Stroke Patients Only)       Balance Overall balance assessment: Needs assistance Sitting-balance support: Feet supported Sitting balance-Leahy Scale: Fair     Standing balance support: During functional activity;Bilateral upper extremity supported Standing balance-Leahy Scale: Poor                               Pertinent Vitals/Pain Pain Assessment: 0-10 Pain Score: 3  Pain Location: Rt knee Pain Descriptors / Indicators: Aching Pain Intervention(s): Limited activity within patient's tolerance;Monitored during session;Repositioned    Home Living Family/patient expects to be discharged to:: Private residence Living Arrangements: Spouse/significant other Available Help at Discharge: Family Type of Home: House (basement apartment) Home Access: Level entry     Home Layout: One level Home Equipment: Kasandra Knudsen - single point      Prior Function Level of Independence: Independent with assistive device(s)         Comments: pt has been using [place] for gait since Sroke in Oct 2020. Also has fatigue/weakness from MG.     Hand Dominance  Dominant Hand: Right    Extremity/Trunk Assessment   Upper Extremity Assessment Upper Extremity Assessment: Overall WFL for tasks assessed    Lower Extremity Assessment Lower Extremity Assessment: RLE deficits/detail RLE Deficits / Details: good quad activation, no extensor lag with SLR, several attempts and  tactile cues to keep knee straight. pt using adductors to raise LE. RLE Sensation: WNL RLE Coordination: WNL    Cervical / Trunk Assessment Cervical / Trunk Assessment: Normal  Communication   Communication: No difficulties  Cognition Arousal/Alertness: Awake/alert Behavior During Therapy: WFL for tasks assessed/performed Overall Cognitive Status: Within Functional Limits for tasks assessed                                        General Comments      Exercises Total Joint Exercises Ankle Circles/Pumps: AROM;Both;20 reps;Seated   Assessment/Plan    PT Assessment Patient needs continued PT services  PT Problem List Decreased strength;Decreased range of motion;Decreased activity tolerance;Decreased balance;Decreased mobility;Decreased knowledge of use of DME;Decreased knowledge of precautions       PT Treatment Interventions DME instruction;Gait training;Stair training;Therapeutic activities;Functional mobility training;Therapeutic exercise;Balance training;Patient/family education    PT Goals (Current goals can be found in the Care Plan section)  Acute Rehab PT Goals Patient Stated Goal: recover and get back to walking without pain PT Goal Formulation: With patient Time For Goal Achievement: 08/20/20 Potential to Achieve Goals: Good    Frequency 7X/week   Barriers to discharge        Co-evaluation               AM-PAC PT "6 Clicks" Mobility  Outcome Measure Help needed turning from your back to your side while in a flat bed without using bedrails?: A Little Help needed moving from lying on your back to sitting on the side of a flat bed without using bedrails?: A Little Help needed moving to and from a bed to a chair (including a wheelchair)?: A Little Help needed standing up from a chair using your arms (e.g., wheelchair or bedside chair)?: A Little Help needed to walk in hospital room?: A Little Help needed climbing 3-5 steps with a railing? :  A Lot 6 Click Score: 17    End of Session Equipment Utilized During Treatment: Gait belt Activity Tolerance: Patient tolerated treatment well Patient left: in chair;with call bell/phone within reach;with chair alarm set Nurse Communication: Mobility status PT Visit Diagnosis: Other abnormalities of gait and mobility (R26.89);Muscle weakness (generalized) (M62.81);Difficulty in walking, not elsewhere classified (R26.2)    Time: 8101-7510 PT Time Calculation (min) (ACUTE ONLY): 24 min   Charges:   PT Evaluation $PT Eval Low Complexity: 1 Low PT Treatments $Gait Training: 8-22 mins        Verner Mould, DPT Acute Rehabilitation Services Office [phone] Pager [phone]    Jacques Navy 08/13/2020, 4:48 PM

## 2020-08-14 ENCOUNTER — Encounter (HOSPITAL_COMMUNITY): Payer: Self-pay | Admitting: Specialist

## 2020-08-14 LAB — BASIC METABOLIC PANEL
Anion gap: 5 (ref 5–15)
BUN: 18 mg/dL (ref 8–23)
CO2: 29 mmol/L (ref 22–32)
Calcium: 8.6 mg/dL — ABNORMAL LOW (ref 8.9–10.3)
Chloride: 106 mmol/L (ref 98–111)
Creatinine, Ser: 0.84 mg/dL (ref 0.61–1.24)
GFR, Estimated: >60 mL/min (ref >60)
Glucose, Bld: 133 mg/dL — ABNORMAL HIGH (ref 70–99)
Potassium: 3.9 mmol/L (ref 3.5–5.1)
Sodium: 140 mmol/L (ref 135–145)

## 2020-08-14 LAB — CBC
HCT: 35.9 % — ABNORMAL LOW (ref 39.0–52.0)
Hemoglobin: 11.6 g/dL — ABNORMAL LOW (ref 13.0–17.0)
MCH: 28 pg (ref 26.0–34.0)
MCHC: 32.3 g/dL (ref 30.0–36.0)
MCV: 86.5 fL (ref 80.0–100.0)
Platelets: 126 10*3/uL — ABNORMAL LOW (ref 150–400)
RBC: 4.15 MIL/uL — ABNORMAL LOW (ref 4.22–5.81)
RDW: 14.1 % (ref 11.5–15.5)
WBC: 10.3 10*3/uL (ref 4.0–10.5)
nRBC: 0 % (ref 0.0–0.2)

## 2020-08-14 MED ORDER — BACLOFEN 10 MG PO TABS
10.0000 mg | ORAL_TABLET | Freq: Three times a day (TID) | ORAL | Status: DC | PRN
  Administered 2020-08-14 – 2020-08-15 (×4): 10 mg via ORAL
  Filled 2020-08-14 (×4): qty 1

## 2020-08-14 NOTE — Op Note (Signed)
NAME: EDSON, DERIDDER MEDICAL RECORD NO: [id number] ACCOUNT NO: [id number] DATE OF BIRTH: 1950-09-26 FACILITY: [facility] LOCATION: [facility] PHYSICIAN: Johnn Hai, MD  Operative Report   DATE OF PROCEDURE: 08/13/2020  PREOPERATIVE DIAGNOSIS:  End-stage osteoarthrosis, varus deformity of the right knee.  POSTOPERATIVE DIAGNOSIS:  End-stage osteoarthrosis, varus deformity of the right knee.  PROCEDURE PERFORMED:  Right total knee arthroplasty utilizing Attune rotating platform 9 femur, 9 tibia, 5 mm insert, 41 patella.  ANESTHESIA:  Spinal.  ASSISTANT:  Lacie Draft, PA  HISTORY:  This is a 70 year old male with end-stage osteoarthrosis, medial compartment of the right knee and a varus deformity indicated for replacement of the degenerated joint.  Risks and benefits were discussed including bleeding, infection, damage to  neurovascular structures.  No change in symptoms, worsening symptoms, DVT, PE, anesthetic complications, etc.  DESCRIPTION OF PROCEDURE:  With the patient in supine position.  After induction of adequate spinal anesthesia, 2 grams Kefzol, the right lower extremity was prepped and draped and exsanguinated in the usual sterile fashion.  Thigh tourniquet inflated to  200 mmHg.  Midline incision was then made over the patella.  Full thickness flaps developed median parapatellar arthrotomy performed.  Patella everted.  Soft tissues were elevated medially, protecting the MCL.  Knee was flexed.  Tricompartmental  osteoarthrosis was noted, bone-on-bone particularly patellofemoral joint and the medial compartment.  L[place]ell rongeur was utilized to remove osteophytes.  Remnants of the medial and lateral menisci.  A notch was made above the femoral notch.  A step  drill was utilized and the femoral canal was irrigated.  A 5-degree right with 10 off the distal femur was selected due to the slight flexion contracture.  This was cut, soft tissue was protected at all times.  We sized off  the anterior cortex of the  femur to a #9.  This was pinned in 3 degrees of external rotation.  Following this, a femoral jig was then applied.  Cutting block performed anterior, posterior and chamfer cuts with soft tissue protected posteriorly at all times.  We subluxed the tibia  gently.  I used a Crego than a McHale.  Remnants of medial and lateral menisci were removed.  Cauterized geniculates.  Eburnated bone posteromedial was noted.  External alignment guide was used 2 of the defect, which was medial.  Parallel to the shaft  bisecting the tibiotalar joint 3-degree slope.  I then changed it to a 1, which was 9 off the lateral side.  This was pinned and I performed a cut with an oscillating saw while protecting the posterior elements including the popliteus.  I then drilled  the eburnated bone, which was a small area posteromedially.  I checked my extension gap and that was satisfactory with the #5 insert.  Knee was then flexed and subluxed the tibia and sized to a #9.  Maximizing the coverage just to the medial aspect of  the tibial tubercle.  This was then pinned, harvested bone centrally and impacted into the femoral canal.  Using a central drill and then a punch guide.  We then returned our attention back to the femur.  Box cut was then performed, after a cutting jig  was applied bisecting the canal.  Box cut was then performed and rasped.  We placed a trial 9 femur, drilled our lug holes.  I placed a 5 insert and reduced the knee and had full extension, full flexion, and good stability with varus and valgus stressing  at 0-30 degrees.  Negative anterior drawer.  We turned our attention to the patella, was everted, measured to a 25 planed to a 16.  Utilizing the external alignment guide and jig.  I then measured a 41 medializing the placement of the paddle.  It was  parallel to the joint surface.  I drilled my peg holes.  I then placed a trial 41 patella and reduced it and had excellent patellofemoral  tracking.  All instrumentation was then removed.  We checked posteriorly and some remnants of the medial and lateral  menisci were removed, two large loose bodies that were in the posterior recess were removed without difficulty, skeletonizing them and then removing them.  Capsule intact.  As it was a popliteus.  We then copiously irrigated with pulsatile lavage.   Flexed the knee, everted the patella.  All surfaces were thoroughly dried.  Mixed cement on the back table in appropriate fashion and placed some on each of the implants.  I then injected into the tibial canal, digitally pressurizing it.  I cemented and  impacted the tibial tray.  I cemented and impacted the femur, placed a 5 trial insert, reduced it, held in axial load throughout the curing of cement with redundant cement removed.  We cemented and clamped the patella.  Placed in full extension with an  axial load throughout the curing of the cement, 0.25% Marcaine with epinephrine was placed in the joint during the curing of the cement.  Following this, tourniquet was deflated at 76 minutes.  Any bleeding, which was minor was cauterized with the  Aquamantys.  The Aquamantys was also utilized to cauterize the geniculates.  I had excellent range of motion and stability.  I selected a 5 removed the 5 and meticulously removed all redundant cement.  Copiously irrigated with pulsatile lavage, subluxed  the tibia, placed a 5 permanent insert, reduced it, I had full extension, full flexion, good stability to varus and valgus stressing at 0-30 degrees and negative anterior drawer.  Excellent patellofemoral tracking.  Following this, the knee in slight  flexion and reapproximated the patellar arthrotomy with #1 Vicryl in interrupted figure-of-eight sutures and then was oversewn with a running Stratafix.  Had excellent patellofemoral tracking following this and flexion to gravity at 90 degrees.   Irrigated subcutaneous tissue.  Exparel 30 mL was injected  into the joint, in his quadriceps tendon and medially.  I then closed the subQ with 2-0 and the skin with staples.  Wound was dressed sterilely placed in immobilizer and transported to the  recovery room in satisfactory condition.  The patient tolerated the procedure well.  No complications.  Assistant, Lacie Draft, PA who was needed throughout the case for closure, holding of the extremity, etc.  BLOOD LOSS:  50 mL.   PUS D: 08/13/2020 11:17:46 am T: 08/14/2020 4:52:00 am  JOB: [id number]/ [id number]

## 2020-08-14 NOTE — Progress Notes (Signed)
Physical Therapy Treatment Patient Details Name: Vincent Liu MRN: [id number] DOB: 06-05-1950 Today's Date: 08/14/2020    History of Present Illness Patient is 70 y.o. male s/p Rt TKA on 08/13/20 with PMH significant for OA, asthma, GERD, HTN, MG, Stroke.    PT Comments    POD # 1 am session Pt already OOB in recliner but R knee resting at approx 45 degree flexion with hip ext rotated.  Educated on importance of "keeping that leg straight" while at rest to achieve knee extension.  Applied KI for amb.  Assisted out of recliner to amb.   General transfer comment: cues for technique/hand placement for power up, assist needed to initiate and steady with rise.  slightly impulsive. General transfer comment: cues for technique/hand placement for power up, assist needed to initiate and steady with rise.  slightly impulsive. General Gait Details: amb with KI to achieve knee extension and stabelize.  Very unsteady gait with minimal self WBing R LE despite VC's to increase heel strike and stance time. Limited distance due to effort.  Recliner following for safety.  Then returned to room to perform some TE's following HEP handout.  Instructed on proper tech, freq as well as use of ICE.     Follow Up Recommendations  Follow surgeon's recommendation for DC plan and follow-up therapies;Home health PT     Equipment Recommendations  None recommended by PT    Recommendations for Other Services       Precautions / Restrictions Precautions Precautions: Fall Precaution Comments: instructed no pillow under knee Restrictions Weight Bearing Restrictions: No Other Position/Activity Restrictions: WBAT    Mobility  Bed Mobility               General bed mobility comments: OOB in recliner    Transfers Overall transfer level: Needs assistance Equipment used: Rolling walker (2 wheeled) Transfers: Sit to/from [facility] Sit to Stand: Min assist Stand pivot transfers: Min  assist       General transfer comment: cues for technique/hand placement for power up, assist needed to initiate and steady with rise.  slightly impulsive.  Ambulation/Gait Ambulation/Gait assistance: Min assist Gait Distance (Feet): 12 Feet Assistive device: Rolling walker (2 wheeled) Gait Pattern/deviations: Step-to pattern;Decreased stride length;Decreased stance time - right;Decreased weight shift to right Gait velocity: decr   General Gait Details: amb with KI to achieve knee extension and stabelize.  Very unsteady gait with minimal self WBing R LE despite VC's to increase heel strike and stance time.   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, dispensing Comments        Pertinent Vitals/Pain Pain Assessment: 0-10 Pain Score: 5  Pain Location: Rt knee Pain Descriptors /  Indicators: Aching;Grimacing;Operative site guarding;Tightness Pain Intervention(s): Monitored during session;Premedicated before session;Repositioned;Patient requesting pain meds-RN notified    Home Living                      Prior Function            PT Goals (current goals can now be found in the care plan section) Progress towards PT goals: Progressing toward goals    Frequency    7X/week      PT Plan Current plan remains appropriate    Co-evaluation              AM-PAC PT "6 Clicks" Mobility   Outcome Measure  Help needed turning from your back to your side while in a flat bed without using bedrails?: A Little Help needed moving from lying on your back to  sitting on the side of a flat bed without using bedrails?: A Little Help needed moving to and from a bed to a chair (including a wheelchair)?: A Little Help needed standing up from a chair using your arms (e.g., wheelchair or bedside chair)?: A Little Help needed to walk in hospital room?: A Little Help needed climbing 3-5 steps with a railing? : A Lot 6 Click Score: 17    End of Session Equipment Utilized During Treatment: Gait belt Activity Tolerance: Patient limited by fatigue;No increased pain Patient left: in chair;with call bell/phone within reach;with chair alarm set Nurse Communication: Mobility status PT Visit Diagnosis: Other abnormalities of gait and mobility (R26.89);Muscle weakness (generalized) (M62.81);Difficulty in walking, not elsewhere classified (R26.2)     Time: 0762-2633 PT Time Calculation (min) (ACUTE ONLY): 25 min  Charges:  $Gait Training: 8-22 mins $Therapeutic Activity: 8-22 mins                     Rica Koyanagi  PTA Acute  Rehabilitation Services Pager      [phone] Office      [phone]

## 2020-08-14 NOTE — Progress Notes (Signed)
Foley catheter removed per order, pt tolerated well. Peri care completed.

## 2020-08-14 NOTE — Progress Notes (Signed)
Subjective: 1 Day Post-Op Procedure(s) (LRB): TOTAL KNEE ARTHROPLASTY (Right) Patient reports pain as 3 on 0-10 scale.   Denies CP or SOB.  Voiding without difficulty. Positive flatus. Objective: Vital signs in last 24 hours: Temp:  [97.5 F (36.4 C)-98.4 F (36.9 C)] 98.1 F (36.7 C) (04/21 0423) Pulse Rate:  [56-81] 81 (04/21 0914) Resp:  [11-19] 18 (04/21 0914) BP: (91-137)/(75-87) 122/83 (04/21 0914) SpO2:  [98 %-100 %] 100 % (04/21 0914) Weight:  [106.6 kg] 106.6 kg (04/20 1346)  Intake/Output from previous day: 04/20 0701 - 04/21 0700 In: 2577.8 [P.O.:360; I.V.:1817.8; IV Piggyback:400] Out: 275 [Urine:225; Blood:50] Intake/Output this shift: Total I/O In: 360 [P.O.:360] Out: -   Recent Labs    08/14/20 0314  HGB 11.6*   Recent Labs    08/14/20 0314  WBC 10.3  RBC 4.15*  HCT 35.9*  PLT 126*   Recent Labs    08/14/20 0314  NA 140  K 3.9  CL 106  CO2 29  BUN 18  CREATININE 0.84  GLUCOSE 133*  CALCIUM 8.6*   No results for input(s): LABPT, INR in the last 72 hours.  Neurologically intact Neurovascular intact Sensation intact distally Dorsiflexion/Plantar flexion intact Compartment soft  Assessment/Plan:  1 Day Post-Op Procedure(s) (LRB): TOTAL KNEE ARTHROPLASTY (Right)  Doing well Advance diet Up with therapy Plan for discharge tomorrow  Baclofen helping as out of splint.   Active Problems:   S/P TKR (total knee replacement) using cement      Johnn Hai 08/14/2020, @NOW 

## 2020-08-14 NOTE — Progress Notes (Signed)
    Subjective:  Patient reports pain as mild to moderate.  Denies N/V/CP/SOB.   Objective:   VITALS:   Vitals:   08/14/20 0019 08/14/20 0423 08/14/20 0454 08/14/20 0914  BP: 137/83 91/75 125/77 122/83  Pulse: 80 79 79 81  Resp: 19 19  18   Temp: 98.4 F (36.9 C) 98.1 F (36.7 C)    TempSrc: Oral Oral    SpO2: 99% 100%  100%  Weight:      Height:        NAD ABD soft Neurovascular intact Sensation intact distally Intact pulses distally Dorsiflexion/Plantar flexion intact Incision: dressing C/D/I Immobilizer in place  Lab Results  Component Value Date   WBC 10.3 08/14/2020   HGB 11.6 (L) 08/14/2020   HCT 35.9 (L) 08/14/2020   MCV 86.5 08/14/2020   PLT 126 (L) 08/14/2020   BMET    Component Value Date/Time   NA 140 08/14/2020 0314   K 3.9 08/14/2020 0314   CL 106 08/14/2020 0314   CO2 29 08/14/2020 0314   GLUCOSE 133 (H) 08/14/2020 0314   BUN 18 08/14/2020 0314   CREATININE 0.84 08/14/2020 0314   CALCIUM 8.6 (L) 08/14/2020 0314   GFRNONAA >60 08/14/2020 0314   GFRAA >60 08/26/2019 2051     Assessment/Plan: 1 Day Post-Op   Active Problems:   S/P TKR (total knee replacement) using cement  Seen in rounds for Dr.Beane WBAT with walker DVT ppx: Aspirin, SCDs, TEDS PO pain control PT/OT Dispo: D/C pending     Dorothyann Peng 08/14/2020, 10:16 AM   [facility] is now [facility] [address], [place], [place] [address] Phone: [phone] [email or web address]  [employer]

## 2020-08-14 NOTE — Progress Notes (Signed)
Physical Therapy Treatment Patient Details Name: Vincent Liu MRN: [id number] DOB: 03-03-1951 Today's Date: 08/14/2020    History of Present Illness Patient is 70 y.o. male s/p Rt TKA on 08/13/20 with PMH significant for OA, asthma, GERD, HTN, MG, Stroke.    PT Comments    POD # 1 pm session Spouse present during session.  Assisted with amb an increased distance but still with some difficulty.  General Gait Details: amb with KI to achieve knee extension and stabelize.  Very unsteady gait with minimal self WBing R LE despite VC's to increase heel strike and stance time.  amb with spouse. Assisted back to bed per pt request.  Instructed spouse on proper application and removal of KI.  Also advised pt wear KI during night to ensure extension. Performed a few TE's but limited by muscle spasms.   Pt hopes to D/C to home tomorrow.  No stairs.  Entry level basement studio in Son's home.     Follow Up Recommendations  Follow surgeon's recommendation for DC plan and follow-up therapies;Home health PT     Equipment Recommendations  None recommended by PT    Recommendations for Other Services       Precautions / Restrictions Precautions Precautions: Fall Precaution Comments: instructed no pillow under knee Restrictions Weight Bearing Restrictions: No Other Position/Activity Restrictions: WBAT    Mobility  Bed Mobility   Bed Mobility: Sit to Supine       Sit to supine: Mod assist   General bed mobility comments: demonstarted and instructed spouse "hands on" how to assist/support LE back onto bed    Transfers Overall transfer level: Needs assistance Equipment used: Rolling walker (2 wheeled) Transfers: Sit to/from [facility] Sit to Stand: Min assist Stand pivot transfers: Min assist       General transfer comment: cues for technique/hand placement for power up, assist needed to initiate and steady with rise.  slightly  impulsive.  Ambulation/Gait Ambulation/Gait assistance: Min assist Gait Distance (Feet): 25 Feet Assistive device: Rolling walker (2 wheeled) Gait Pattern/deviations: Step-to pattern;Decreased stride length;Decreased stance time - right;Decreased weight shift to right Gait velocity: decr   General Gait Details: amb with KI to achieve knee extension and stabelize.  Very unsteady gait with minimal self WBing R LE despite VC's to increase heel strike and stance time.  amb with spouse.   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, dispensing Comments        Pertinent Vitals/Pain Pain Assessment: 0-10 Pain Score: 5  Pain Location: Rt knee Pain Descriptors / Indicators: Aching;Grimacing;Operative site guarding;Tightness Pain Intervention(s): Monitored during session;Premedicated before session;Repositioned;Patient requesting pain meds-RN notified    Home Living  Prior Function            PT Goals (current goals can now be found in the care plan section) Progress towards PT goals: Progressing toward goals    Frequency    7X/week      PT Plan Current plan remains appropriate    Co-evaluation              AM-PAC PT "6 Clicks" Mobility   Outcome Measure  Help needed turning from your back to your side while in a flat bed without using bedrails?: A Little Help needed moving from lying on your back to sitting on the side of a flat bed without using bedrails?: A Little Help needed moving to and from a bed to a chair (including a  wheelchair)?: A Little Help needed standing up from a chair using your arms (e.g., wheelchair or bedside chair)?: A Little Help needed to walk in hospital room?: A Little Help needed climbing 3-5 steps with a railing? : A Lot 6 Click Score: 17    End of Session Equipment Utilized During Treatment: Gait belt Activity Tolerance: Patient limited by fatigue;No increased pain Patient left: in bed Nurse Communication: Mobility status PT Visit Diagnosis: Other abnormalities of gait and mobility (R26.89);Muscle weakness (generalized) (M62.81);Difficulty in walking, not elsewhere classified (R26.2)     Time: 6659-9357 PT Time Calculation (min) (ACUTE ONLY): 33 min  Charges:  $Gait Training: 8-22 mins $Therapeutic Activity: 8-22 mins                     {Caren Garske  PTA Acute  Rehabilitation Services Pager      [phone] Office      [phone]

## 2020-08-15 LAB — CBC
HCT: 32.5 % — ABNORMAL LOW (ref 39.0–52.0)
Hemoglobin: 10.5 g/dL — ABNORMAL LOW (ref 13.0–17.0)
MCH: 27.8 pg (ref 26.0–34.0)
MCHC: 32.3 g/dL (ref 30.0–36.0)
MCV: 86 fL (ref 80.0–100.0)
Platelets: 112 10*3/uL — ABNORMAL LOW (ref 150–400)
RBC: 3.78 MIL/uL — ABNORMAL LOW (ref 4.22–5.81)
RDW: 14.4 % (ref 11.5–15.5)
WBC: 12 10*3/uL — ABNORMAL HIGH (ref 4.0–10.5)
nRBC: 0 % (ref 0.0–0.2)

## 2020-08-15 NOTE — Progress Notes (Signed)
Physical Therapy Treatment Patient Details Name: Vincent Liu MRN: [id number] DOB: 1951-02-18 Today's Date: 08/15/2020    History of Present Illness Patient is 70 y.o. male s/p Rt TKA on 08/13/20 with PMH significant for OA, asthma, GERD, HTN, MG, Stroke.    PT Comments    POD # 2 pm session Much improved ability to amb with spouse a functional distance.  General Gait Details: tolerated an increased distance this afternoon.  Spouse present and assisted "hands on" with instruction to use safety belt. Then returned to room to perform some TE's following HEP handout.  Instructed on proper tech, freq as well as use of ICE.   Addressed all mobility questions, discussed appropriate activity, educated on use of ICE.  Pt ready for D/C to home.   Follow Up Recommendations  Follow surgeon's recommendation for DC plan and follow-up therapies;Home health PT     Equipment Recommendations  None recommended by PT    Recommendations for Other Services       Precautions / Restrictions Precautions Precautions: Fall Precaution Comments: instructed no pillow under knee and to wear KI at night to promote knee extension Restrictions Weight Bearing Restrictions: No Other Position/Activity Restrictions: WBAT    Mobility  Bed Mobility Overal bed mobility: Needs Assistance Bed Mobility: Supine to Sit     Supine to sit: Min assist;HOB elevated     General bed mobility comments: OOB in recliner    Transfers Overall transfer level: Needs assistance Equipment used: Rolling walker (2 wheeled) Transfers: Sit to/from [facility] Sit to Stand: Min guard;Min assist         General transfer comment: pt more able to self rise with VC's on proper hand placement to push up vs pull up on walker.  Ambulation/Gait Ambulation/Gait assistance: Min guard Gait Distance (Feet): 45 Feet Assistive device: Rolling walker (2 wheeled) Gait Pattern/deviations: Step-to pattern;Decreased  stride length;Decreased stance time - right;Decreased weight shift to right Gait velocity: decreased   General Gait Details: tolerated an increased distance this afternoon.  Spouse present and assisted "hands on" with instruction to use safety belt.   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      Exercises   Total Knee Replacement TE's following HEP handout 10 reps B LE ankle pumps 05 reps towel squeezes 05 reps knee presses 05 reps heel slides  05 reps SAQ's 05 reps SLR's 05 reps ABD Educated on use of gait belt to assist with TE's Followed by ICE     General Comments        Pertinent Vitals/Pain Pain Assessment: 0-10 Pain Score: 6  Pain Location: Rt knee Pain Descriptors / Indicators: Aching;Grimacing;Operative site guarding;Tightness Pain Intervention(s): Monitored during session;Premedicated before session;Repositioned;Ice applied    Home Living  Prior Function            PT Goals (current goals can now be found in the care plan section) Progress towards PT goals: Progressing toward goals    Frequency    7X/week      PT Plan Current plan remains appropriate    Co-evaluation              AM-PAC PT "6 Clicks" Mobility   Outcome Measure  Help needed turning from your back to your side while in a flat bed without using bedrails?: A Little Help needed moving from lying on your back to sitting on the side of a flat bed without using bedrails?: A Little Help needed moving to and from a bed to a chair (including a wheelchair)?: A Little Help needed standing up from  a chair using your arms (e.g., wheelchair or bedside chair)?: A Little Help needed to walk in hospital room?: A Lot Help needed climbing 3-5 steps with a railing? : Total 6 Click Score: 15    End of Session Equipment Utilized During Treatment: Gait belt Activity Tolerance: Other (comment) Patient left: in chair;with call bell/phone within reach;with chair alarm set Nurse Communication: Mobility status PT Visit Diagnosis: Other abnormalities of gait and mobility (R26.89);Muscle weakness (generalized) (M62.81);Difficulty in walking, not elsewhere classified (R26.2)     Time: 5465-6812 PT Time Calculation (min) (ACUTE ONLY): 28 min  Charges:  $Gait Training: 8-22 mins $Therapeutic Activity: 8-22 mins                     Rica Koyanagi  PTA Acute  Rehabilitation Services Pager      [phone] Office      [phone]

## 2020-08-15 NOTE — TOC Transition Note (Signed)
Transition of Care [facility]) - CM/SW Discharge Note   Patient Details  Name: Vincent Liu MRN: [id number] Date of Birth: 1950-05-17  [facility] CM/SW Contact:  Vincent Pall, Vincent Liu Phone Number: 08/15/2020, 1:48 PM   Clinical Narrative:    Pt for dc today and orders in for HHPT/OT.  Pt without any agency preference.  Referral placed with [facility].  No DME needs.  No further TOC needs.   Final next level of care: [place] Barriers to Discharge: No Barriers Identified   Patient Goals and CMS Choice Patient states their goals for this hospitalization and ongoing recovery are:: return home CMS Medicare.gov Compare Post Acute Care list provided to:: Patient Choice offered to / list presented to : Patient  Discharge Placement                       Discharge Plan and Services                DME Arranged: N/A DME Agency: NA       HH Arranged: PT,OT Vincent Liu Agency: [facility] ([place] branch) Date HH Agency Contacted: 08/15/20 Time HH Agency Contacted: 1000 Representative spoke with at [place]: [place] ([place]) Interventions     Readmission Risk Interventions No flowsheet data found.

## 2020-08-15 NOTE — Progress Notes (Signed)
Subjective: 2 Days Post-Op Procedure(s) (LRB): TOTAL KNEE ARTHROPLASTY (Right) Patient reports pain as 4 on 0-10 scale.   Denies CP or SOB.  Voiding without difficulty. Positive flatus. Objective: Vital signs in last 24 hours: Temp:  [98 F (36.7 C)-98.4 F (36.9 C)] 98.3 F (36.8 C) (04/22 0544) Pulse Rate:  [69-81] 78 (04/22 0544) Resp:  [18-20] 18 (04/22 0544) BP: (115-125)/(59-83) 125/71 (04/22 0544) SpO2:  [94 %-100 %] 94 % (04/22 0544)  Intake/Output from previous day: 04/21 0701 - 04/22 0700 In: 360 [P.O.:360] Out: 550 [Urine:550] Intake/Output this shift: Total I/O In: -  Out: 550 [Urine:550]  Recent Labs    08/14/20 0314 08/15/20 0303  HGB 11.6* 10.5*   Recent Labs    08/14/20 0314 08/15/20 0303  WBC 10.3 12.0*  RBC 4.15* 3.78*  HCT 35.9* 32.5*  PLT 126* 112*   Recent Labs    08/14/20 0314  NA 140  K 3.9  CL 106  CO2 29  BUN 18  CREATININE 0.84  GLUCOSE 133*  CALCIUM 8.6*   No results for input(s): LABPT, INR in the last 72 hours.  Neurologically intact Sensation intact distally Intact pulses distally Dorsiflexion/Plantar flexion intact Incision: dressing C/D/I  Assessment/Plan:  2 Days Post-Op Procedure(s) (LRB): TOTAL KNEE ARTHROPLASTY (Right)  Doing well Advance diet Up with therapy D/C IV fluids Discharge home with home health  Needs home PT in [place] D/C instructions given   Active Problems:   S/P TKR (total knee replacement) using cement      Vincent Liu 08/15/2020, @[facility] 

## 2020-08-15 NOTE — Plan of Care (Signed)
  Problem: Pain Management: Goal: Pain level will decrease with appropriate interventions Outcome: Progressing   

## 2020-08-15 NOTE — Progress Notes (Signed)
Physical Therapy Treatment Patient Details Name: Vincent Liu MRN: [id number] DOB: 02-Dec-1950 Today's Date: 08/15/2020    History of Present Illness Patient is 70 y.o. male s/p Rt TKA on 08/13/20 with PMH significant for OA, asthma, GERD, HTN, MG, Stroke.    PT Comments    POD # 2 am session Pt did not do as well as expected.  General transfer comment: required increased assist this session from elevated bed with c/o feeling "swimmy" and "diaphoretic".  BP EOB 132/81 HR 69 then BP standing 113/80 HR 95.  RN in room at time also assisting. General Gait Details: decreased amb distance this session due to continued c/o feeling "swimmy" BP after 8 feet 126/94 HR 73 with recliner following closely behind.  Pt is pale in color (lips). Left KI on to position R LE in extension Pt will need another PT session this afternoon.   Follow Up Recommendations  Follow surgeon's recommendation for DC plan and follow-up therapies;Home health PT     Equipment Recommendations  None recommended by PT    Recommendations for Other Services       Precautions / Restrictions Precautions Precautions: Fall Precaution Comments: instructed no pillow under knee Restrictions Weight Bearing Restrictions: No RLE Weight Bearing: Weight bearing as tolerated Other Position/Activity Restrictions: WBAT    Mobility  Bed Mobility Overal bed mobility: Needs Assistance Bed Mobility: Supine to Sit     Supine to sit: Min assist;HOB elevated     General bed mobility comments: demonstarted and instructed spouse "hands on" how to assist/support LE back onto bed required increased time and effort this session    Transfers Overall transfer level: Needs assistance Equipment used: Rolling walker (2 wheeled) Transfers: Sit to/from [facility] Sit to Stand: Mod assist         General transfer comment: required increased assist this session from elevated bed with c/o feeling "swimmy" and  "diaphoretic".  BP EOB 132/81 HR 69 then BP standing 113/80 HR 95.  RN in room at time also assisting.  Ambulation/Gait Ambulation/Gait assistance: Mod assist;+2 safety/equipment Gait Distance (Feet): 8 Feet Assistive device: Rolling walker (2 wheeled) Gait Pattern/deviations: Step-to pattern;Decreased stride length;Decreased stance time - right;Decreased weight shift to right Gait velocity: decreased   General Gait Details: decreased amb distance this session due to continued c/o feeling "swimmy" BP after 8 feet 126/94 HR 73 with recliner following closely behind.  Pt is pale in color (lips).   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      Exercises      General Comments        Pertinent Vitals/Pain Pain Assessment: 0-10 Pain Score: 5  Pain Location: Rt knee Pain Descriptors / Indicators: Aching;Grimacing;Operative site guarding;Tightness Pain Intervention(s): Monitored during session;Premedicated before session;Repositioned;Ice applied  Home Living                      Prior Function            PT Goals (current goals can now be found in the care plan section) Progress towards PT goals: Progressing toward goals    Frequency    7X/week      PT Plan Current plan remains appropriate    Co-evaluation              AM-PAC PT "6 Clicks" Mobility   Outcome Measure  Help needed turning from your back to your side while in a flat bed without using bedrails?: A Little Help needed moving from lying on your back to sitting on the side of a flat bed without using bedrails?: A Little Help needed  moving to and from a bed to a chair (including a wheelchair)?: A Little Help needed standing up from a chair using your arms (e.g., wheelchair or bedside chair)?: A Little Help needed to walk in hospital room?: A Lot Help needed climbing 3-5 steps with a railing? : Total 6 Click Score: 15    End of Session Equipment Utilized During Treatment: Gait belt Activity Tolerance: Other (comment) Patient left: in chair;with call bell/phone within reach;with chair alarm set Nurse Communication: Mobility status PT Visit Diagnosis: Other abnormalities of gait and mobility (R26.89);Muscle weakness (generalized) (M62.81);Difficulty in walking, not elsewhere classified (R26.2)     Time: 1010-1035 PT Time Calculation (min) (ACUTE ONLY): 25 min  Charges:  $Gait Training: 8-22 mins $Therapeutic Activity: 8-22 mins                     Rica Koyanagi  PTA Acute  Rehabilitation Services Pager      [phone] Office      [phone]

## 2020-08-15 NOTE — Progress Notes (Signed)
The patient is alert and oriented and has been seen by his physician. The orders for discharge were written. IV has been removed. Went over discharge instructions with patient and family. He will be discharged via wheelchair with all of his belongings.

## 2020-08-22 NOTE — Discharge Summary (Signed)
Physician Discharge Summary   Patient ID: Vincent Liu MRN: [id number] DOB/AGE: 1951/03/03 71 y.o.  Admit date: 08/13/2020 Discharge date: 08/15/2020  Primary Diagnosis: Right knee primary osteoarthritis Admission Diagnoses:  Past Medical History:  Diagnosis Date  . Allergy   . Arthritis   . Asthma   . Cataract    bilateral - just watching  . GERD (gastroesophageal reflux disease)   . Hemorrhoids   . Hypertension   . MG (myasthenia gravis) ([place])   . Prostate cancer ([place])   . Stroke [facility])    10/05-2018- slight right    Discharge Diagnoses:   Active Problems:   S/P TKR (total knee replacement) using cement  Estimated body mass index is 31 kg/m as calculated from the following:   Height as of this encounter: 6\' 1"  (1.854 m).   Weight as of this encounter: 106.6 kg.  Procedure:  Procedure(s) (LRB): TOTAL KNEE ARTHROPLASTY (Right)   Consults: None  HPI: see H&P Laboratory Data: Admission on 08/13/2020, Discharged on 08/15/2020  Component Date Value Ref Range Status  . WBC 08/14/2020 10.3  4.0 - 10.5 K/uL Final  . RBC 08/14/2020 4.15* 4.22 - 5.81 MIL/uL Final  . Hemoglobin 08/14/2020 11.6* 13.0 - 17.0 g/dL Final  . HCT 08/14/2020 35.9* 39.0 - 52.0 % Final  . MCV 08/14/2020 86.5  80.0 - 100.0 fL Final  . MCH 08/14/2020 28.0  26.0 - 34.0 pg Final  . MCHC 08/14/2020 32.3  30.0 - 36.0 g/dL Final  . RDW 08/14/2020 14.1  11.5 - 15.5 % Final  . Platelets 08/14/2020 126* 150 - 400 K/uL Final  . nRBC 08/14/2020 0.0  0.0 - 0.2 % Final   Performed at [facility], [place] [address], [place], [place] [address]  . Sodium 08/14/2020 140  135 - 145 mmol/L Final  . Potassium 08/14/2020 3.9  3.5 - 5.1 mmol/L Final  . Chloride 08/14/2020 106  98 - 111 mmol/L Final  . CO2 08/14/2020 29  22 - 32 mmol/L Final  . Glucose, Bld 08/14/2020 133* 70 - 99 mg/dL Final   Glucose reference range applies only to samples taken after fasting for at least 8 hours.  . BUN  08/14/2020 18  8 - 23 mg/dL Final  . Creatinine, Ser 08/14/2020 0.84  0.61 - 1.24 mg/dL Final  . Calcium 08/14/2020 8.6* 8.9 - 10.3 mg/dL Final  . GFR, Estimated 08/14/2020 >60  >60 mL/min Final   Comment: (NOTE) Calculated using the CKD-EPI Creatinine Equation (2021)   . Anion gap 08/14/2020 5  5 - 15 Final   Performed at [facility], [place] [address], [place], [place] [address]  . WBC 08/15/2020 12.0* 4.0 - 10.5 K/uL Final  . RBC 08/15/2020 3.78* 4.22 - 5.81 MIL/uL Final  . Hemoglobin 08/15/2020 10.5* 13.0 - 17.0 g/dL Final  . HCT 08/15/2020 32.5* 39.0 - 52.0 % Final  . MCV 08/15/2020 86.0  80.0 - 100.0 fL Final  . MCH 08/15/2020 27.8  26.0 - 34.0 pg Final  . MCHC 08/15/2020 32.3  30.0 - 36.0 g/dL Final  . RDW 08/15/2020 14.4  11.5 - 15.5 % Final  . Platelets 08/15/2020 112* 150 - 400 K/uL Final   Comment: SPECIMEN CHECKED FOR CLOTS Immature Platelet Fraction may be clinically indicated, consider ordering this additional test [id number]   . nRBC 08/15/2020 0.0  0.0 - 0.2 % Final   Performed at [place] [address] [place], [place] [address]  Hospital Outpatient Visit on 08/11/2020  Component Date Value Ref Range Status  . SARS Coronavirus 2 08/11/2020 NEGATIVE  NEGATIVE Final   Comment: (NOTE) SARS-CoV-2 target nucleic acids are NOT DETECTED.  The SARS-CoV-2 RNA is generally detectable in upper and lower respiratory specimens during the acute phase of infection. Negative results do not preclude SARS-CoV-2 infection, do not rule out co-infections with other pathogens, and should not be used as the sole basis for treatment or other patient management decisions. Negative results must be combined with clinical observations, patient history, and epidemiological information. The expected result is Negative.  Fact Sheet for Patients: SugarRoll.be  Fact Sheet for Healthcare  Providers: https://www.woods-mathews.com/  This test is not yet approved or cleared by the [place] FDA and  has been authorized for detection and/or diagnosis of SARS-CoV-2 by FDA under an Emergency Use Authorization (EUA). This EUA will remain  in effect (meaning this test can be used) for the duration of the COVID-19 declaration under Se                          ction 564(b)(1) of the Act, 21 U.S.C. section 360bbb-3(b)(1), unless the authorization is terminated or revoked sooner.  Performed at [facility], [place] [address] [place], [place] [address]   Hospital Outpatient Visit on 08/11/2020  Component Date Value Ref Range Status  . MRSA, PCR 08/11/2020 NEGATIVE  NEGATIVE Final  . Staphylococcus aureus 08/11/2020 NEGATIVE  NEGATIVE Final   Comment: (NOTE) The Xpert SA Assay (FDA approved for NASAL specimens in patients 73 years of age and older), is one component of a comprehensive surveillance program. It is not intended to diagnose infection nor to guide or monitor treatment. Performed at [facility], [place] [address] [place], [place] [address]   . WBC 08/11/2020 7.4  4.0 - 10.5 K/uL Final  . RBC 08/11/2020 4.83  4.22 - 5.81 MIL/uL Final  . Hemoglobin 08/11/2020 13.4  13.0 - 17.0 g/dL Final  . HCT 08/11/2020 41.4  39.0 - 52.0 % Final  . MCV 08/11/2020 85.7  80.0 - 100.0 fL Final  . MCH 08/11/2020 27.7  26.0 - 34.0 pg Final  . MCHC 08/11/2020 32.4  30.0 - 36.0 g/dL Final  . RDW 08/11/2020 14.2  11.5 - 15.5 % Final  . Platelets 08/11/2020 138* 150 - 400 K/uL Final  . nRBC 08/11/2020 0.0  0.0 - 0.2 % Final   Performed at [facility], [place] [address], [place], [place] [address]  . Sodium 08/11/2020 143  135 - 145 mmol/L Final  . Potassium 08/11/2020 4.2  3.5 - 5.1 mmol/L Final  . Chloride 08/11/2020 107  98 - 111 mmol/L Final  . CO2 08/11/2020 29  22 - 32 mmol/L Final  . Glucose, Bld 08/11/2020 93  70 - 99 mg/dL  Final   Glucose reference range applies only to samples taken after fasting for at least 8 hours.  . BUN 08/11/2020 21  8 - 23 mg/dL Final  . Creatinine, Ser 08/11/2020 0.91  0.61 - 1.24 mg/dL Final  . Calcium 08/11/2020 9.0  8.9 - 10.3 mg/dL Final  . GFR, Estimated 08/11/2020 >60  >60 mL/min Final   Comment: (NOTE) Calculated using the CKD-EPI Creatinine Equation (2021)   . Anion gap 08/11/2020 7  5 - 15 Final   Performed at [facility], [place] [address], [place], [place] [address]  . Prothrombin Time 08/11/2020 14.1  11.4 - 15.2 seconds Final  . INR 08/11/2020 1.1  0.8 - 1.2 Final   Comment: (NOTE) INR goal varies based on device and disease states. Performed at [facility], [place] [address], [place], [place] [address]   . aPTT 08/11/2020 32  24 - 36 seconds Final   Performed at [facility], [place] [address] [place], [place] [address]  . Color, Urine 08/11/2020 YELLOW  YELLOW Final  . APPearance 08/11/2020 CLEAR  CLEAR Final  . Specific Gravity, Urine 08/11/2020 1.018  1.005 - 1.030 Final  . pH 08/11/2020 6.0  5.0 - 8.0 Final  . Glucose, UA 08/11/2020 NEGATIVE  NEGATIVE mg/dL Final  . Hgb urine dipstick 08/11/2020 MODERATE* NEGATIVE Final  . Bilirubin Urine 08/11/2020 NEGATIVE  NEGATIVE Final  . Ketones, ur 08/11/2020 NEGATIVE  NEGATIVE mg/dL Final  . Protein, ur 08/11/2020 30* NEGATIVE mg/dL Final  . Nitrite 08/11/2020 NEGATIVE  NEGATIVE Final  . Leukocytes,Ua 08/11/2020 MODERATE* NEGATIVE Final  . RBC / HPF 08/11/2020 21-50  0 - 5 RBC/hpf Final  . WBC, UA 08/11/2020 >50* 0 - 5 WBC/hpf Final  . Bacteria, UA 08/11/2020 MANY* NONE SEEN Final  . Mucus 08/11/2020 PRESENT   Final   Performed at [facility], [place] [address] [place], [place] [address]     X-Rays:DG Knee 1-2 Views Right  Result Date: 08/13/2020 CLINICAL DATA:  Total knee replacement EXAM: RIGHT KNEE - 1-2 VIEW COMPARISON:  None. FINDINGS:  There are postoperative changes of right total knee arthroplasty. Postoperative soft tissue swelling and air. Skin staples. No evidence of complication. IMPRESSION: Expected postoperative appearance of right total knee arthroplasty. Electronically Signed   By: Macy Mis M.D.   On: 08/13/2020 13:55    EKG: Orders placed or performed during the hospital encounter of 08/11/20  . EKG 12 lead per protocol  . EKG 12 lead per protocol     Hospital Course: Vincent Liu is a 70 y.o. who was admitted to [facility]. They were brought to the operating room on 08/13/2020 and underwent Procedure(s): TOTAL KNEE ARTHROPLASTY.  Patient tolerated the procedure well and was later transferred to the recovery room and then to the orthopaedic floor for postoperative care.  They were given PO and IV analgesics for pain control following their surgery.  They were given 24 hours of postoperative antibiotics of  Anti-infectives (From admission, onward)   Start     Dose/Rate Route Frequency Ordered Stop   08/13/20 1500  ceFAZolin (ANCEF) IVPB 2g/100 mL premix        2 g 200 mL/hr over 30 Minutes Intravenous Every 6 hours 08/13/20 1337 08/13/20 2250   08/13/20 0630  ceFAZolin (ANCEF) IVPB 2g/100 mL premix        2 g 200 mL/hr over 30 Minutes Intravenous On call to O.R. 08/13/20 [id number] 08/13/20 6759     and started on DVT prophylaxis in the form of Aspirin, TED hose and SCDs.   PT and OT were ordered for total joint protocol.  Discharge planning consulted to help with postop disposition and equipment needs.  Patient had a good night on the evening of surgery.  They started to get up OOB with therapy on day one.  Continued to work with therapy into day two.  By day two, the patient had progressed with therapy and meeting their goals.  Incision was healing well.  Patient was seen in rounds and was ready to go home.  Diet: Regular diet Activity:WBAT Follow-up:in 10-14 days Disposition -  Home Discharged Condition: good   Discharge Instructions    Call MD / Call 911   Complete by: As directed    If you experience chest pain or shortness of breath, CALL 911 and be transported to the hospital emergency room.  If you develope a fever above 101 F, pus (white drainage) or increased drainage or redness at the wound, or calf pain, call your surgeon's office.   Constipation Prevention   Complete by: As directed    Drink plenty of fluids.  Prune juice may be helpful.  You may use a stool softener, such as Colace (over the counter) 100 mg twice a day.  Use MiraLax (over the counter) for constipation as needed.   Diet - low sodium heart healthy   Complete by: As directed    Discharge instructions   Complete by: As directed    Get an appointment with your medical physician to get a blood test to check your platelets.   Driving restrictions   Complete by: As directed    No driving for 3 weeks   Increase activity slowly as tolerated   Complete by: As directed    Post-operative opioid taper instructions:   Complete by: As directed    POST-OPERATIVE OPIOID TAPER INSTRUCTIONS: It is important to wean off of your opioid medication as soon as possible. If you do not need pain medication after your surgery it is ok to stop day one. Opioids include: Codeine, Hydrocodone(Norco, Vicodin), Oxycodone(Percocet, oxycontin) and hydromorphone amongst others.  Long term and even short term use of opiods can cause: Increased pain response Dependence Constipation Depression Respiratory depression And more.  Withdrawal symptoms can include Flu like symptoms Nausea, vomiting And more Techniques to manage these symptoms Hydrate well Eat regular healthy meals Stay active Use relaxation techniques(deep breathing, meditating, yoga) Do Not substitute Alcohol to help with tapering If you have been on opioids for less than two weeks and do not have pain than it is ok to stop all together.  Plan  to wean off of opioids This plan should start within one week post op of your joint replacement. Maintain the same interval or time between taking each dose and first decrease the dose.  Cut the total daily intake of opioids by one tablet each day Next start to increase the time between doses. The last dose that should be eliminated is the evening dose.      TED hose   Complete by: As directed    Use stockings (TED hose) for 3 weeks on both leg(s).  You may remove them at night for sleeping.     Allergies as of 08/15/2020   No Known Allergies     Medication List    STOP taking these medications   rosuvastatin 40 MG tablet Commonly known as: CRESTOR     TAKE these medications   albuterol 108 (90 Base) MCG/ACT inhaler Commonly known as: VENTOLIN HFA Inhale 1-2 puffs into the lungs every 6 (six) hours as needed for wheezing or shortness of breath.   aspirin EC 81 MG tablet Take 1 tablet (81 mg total) by mouth 2 (two) times daily after a meal. Day after surgery What changed:   when to take this  additional instructions   CALCIUM + D3 PO Take 1 tablet by mouth in the morning.   docusate sodium 100 MG capsule Commonly known as: Colace Take 1 capsule (100 mg total) by mouth  2 (two) times daily as needed for mild constipation.   finasteride 5 MG tablet Commonly known as: PROSCAR Take 5 mg by mouth in the morning.   HYDROcodone-acetaminophen 5-325 MG tablet Commonly known as: NORCO/VICODIN Take 1-2 tablets by mouth every 4 (four) hours as needed for severe pain.   mycophenolate 500 MG tablet Commonly known as: CELLCEPT Take 1,000-1,500 mg by mouth See admin instructions. Take 3 tablets (1500 mg) by mouth in the morning & take 2 tablets (1000 mg) by mouth in the evening   polyethylene glycol 17 g packet Commonly known as: MIRALAX / GLYCOLAX Take 17 g by mouth daily.   potassium chloride SA 20 MEQ tablet Commonly known as: KLOR-CON Take 20 mEq by mouth in the  morning.   tamsulosin 0.4 MG Caps capsule Commonly known as: Flomax Take 1 capsule (0.4 mg total) by mouth daily. What changed: when to take this   valsartan-hydrochlorothiazide 160-25 MG tablet Commonly known as: DIOVAN-HCT Take 0.5 tablets by mouth in the morning.       Follow-up Information    Susa Day, MD Follow up in 2 day(s).   Specialty: Orthopedic Surgery Contact information: [address] [place] [address] [place] [address] [phone]        [facility] Follow up.   Why: to provide home health physical therapy Contact information: [phone]              Signed: Lacie Draft, PA-C Orthopaedic Surgery 08/22/2020, 8:41 AM

## 2021-03-24 ENCOUNTER — Encounter: Payer: Self-pay | Admitting: Internal Medicine

## 2021-08-21 ENCOUNTER — Encounter: Payer: Self-pay | Admitting: Internal Medicine

## 2021-10-07 ENCOUNTER — Ambulatory Visit (AMBULATORY_SURGERY_CENTER): Payer: Self-pay | Admitting: *Deleted

## 2021-10-07 VITALS — Ht 70.5 in | Wt 249.0 lb

## 2021-10-07 DIAGNOSIS — Z86010 Personal history of colonic polyps: Secondary | ICD-10-CM

## 2021-10-07 MED ORDER — NA SULFATE-K SULFATE-MG SULF 17.5-3.13-1.6 GM/177ML PO SOLN: 1.0000 | Freq: Once | ORAL | 0 refills | Status: AC

## 2021-10-07 NOTE — Progress Notes (Signed)
No egg or soy allergy known to patient  No issues known to pt with past sedation with any surgeries or procedures Patient denies ever being told they had issues or difficulty with intubation  No FH of Malignant Hyperthermia Pt is not on diet pills Pt is not on  home 02  Pt is not on blood thinners  Pt denies issues with constipation - last colon pt states he had to use an enema so we did a 2 day suprep this time  No A fib or A flutter    NO PA's for preps discussed with pt In PV today  Discussed with pt there will be an out-of-pocket cost for prep and that varies from $0 to 70 +  dollars - pt verbalized understanding  Pt instructed to use  GoodRx for a price reduction on prep

## 2021-10-28 ENCOUNTER — Encounter: Payer: Self-pay | Admitting: Internal Medicine

## 2021-10-28 ENCOUNTER — Ambulatory Visit (AMBULATORY_SURGERY_CENTER): Payer: Medicare Other | Admitting: Internal Medicine

## 2021-10-28 VITALS — BP 131/68 | HR 75 | Temp 97.8°F | Resp 15 | Ht 70.5 in | Wt 249.0 lb

## 2021-10-28 DIAGNOSIS — Z86010 Personal history of colonic polyps: Secondary | ICD-10-CM | POA: Diagnosis not present

## 2021-10-28 DIAGNOSIS — Z09 Encounter for follow-up examination after completed treatment for conditions other than malignant neoplasm: Secondary | ICD-10-CM | POA: Diagnosis not present

## 2021-10-28 DIAGNOSIS — D124 Benign neoplasm of descending colon: Secondary | ICD-10-CM | POA: Diagnosis not present

## 2021-10-28 MED ORDER — SODIUM CHLORIDE 0.9 % IV SOLN: 500.0000 mL | Freq: Once | INTRAVENOUS | Status: DC

## 2021-10-28 NOTE — Progress Notes (Signed)
Report to PACU, RN, vss, BBS= Clear.  

## 2021-10-28 NOTE — Progress Notes (Signed)
Called to room to assist during endoscopic procedure.  Patient ID and intended procedure confirmed with present staff. Received instructions for my participation in the procedure from the performing physician.  

## 2021-10-28 NOTE — Op Note (Addendum)
[place] Patient Name: Vincent Liu Procedure Date: 10/28/2021 10:18 AM MRN: [id number] Endoscopist: Jerene Bears , MD Age: 71 Referring MD:  Date of Birth: 15-Feb-1951 Gender: Male Account #: [id number] Procedure:                Colonoscopy Indications:              High risk colon cancer surveillance: Personal                            history of multiple adenomas, Last colonoscopy:                            October 2019 (6 adenomas removed) Medicines:                Monitored Anesthesia Care Procedure:                Pre-Anesthesia Assessment:                           - Prior to the procedure, a History and Physical                            was performed, and patient medications and                            allergies were reviewed. The patient's tolerance of                            previous anesthesia was also reviewed. The risks                            and benefits of the procedure and the sedation                            options and risks were discussed with the patient.                            All questions were answered, and informed consent                            was obtained. Prior Anticoagulants: The patient has                            taken no previous anticoagulant or antiplatelet                            agents. ASA Grade Assessment: III - A patient with                            severe systemic disease. After reviewing the risks                            and benefits, the patient was deemed in  satisfactory condition to undergo the procedure.                           After obtaining informed consent, the colonoscope                            was passed under direct vision. Throughout the                            procedure, the patient's blood pressure, pulse, and                            oxygen saturations were monitored continuously. The                            Olympus CF-[id number])  Colonoscope was                            introduced through the anus and advanced to the                            cecum, identified by appendiceal orifice and                            ileocecal valve. The colonoscopy was performed                            without difficulty. The patient tolerated the                            procedure well. The quality of the bowel                            preparation was excellent. The ileocecal valve,                            appendiceal orifice, and rectum were photographed. Scope In: 10:27:42 AM Scope Out: 10:39:57 AM Scope Withdrawal Time: 0 hours 10 minutes 13 seconds  Total Procedure Duration: 0 hours 12 minutes 15 seconds  Findings:                 The digital rectal exam was normal.                           There was a small lipoma, in the ascending colon                            and in the cecum.                           A 5 mm polyp was found in the descending colon. The                            polyp was sessile. The polyp was removed with a  cold snare. Resection and retrieval were complete.                           Multiple small and large-mouthed diverticula were                            found in the sigmoid colon and descending colon.                           Internal hemorrhoids were found during                            retroflexion. The hemorrhoids were small. Complications:            No immediate complications. Estimated Blood Loss:     Estimated blood loss: none. Estimated blood loss                            was minimal. Impression:               - Small lipoma in the ascending colon and in the                            cecum.                           - One 5 mm polyp in the descending colon, removed                            with a cold snare. Resected and retrieved.                           - Mild diverticulosis in the sigmoid colon and in                            the  descending colon.                           - Small internal hemorrhoids. Recommendation:           - Patient has a contact number available for                            emergencies. The signs and symptoms of potential                            delayed complications were discussed with the                            patient. Return to normal activities tomorrow.                            Written discharge instructions were provided to the                            patient.                           -  Resume previous diet.                           - Continue present medications.                           - Await pathology results.                           - Repeat colonoscopy in 5 years for surveillance. Jerene Bears, MD 10/28/2021 10:44:07 AM This report has been signed electronically.

## 2021-10-28 NOTE — Progress Notes (Signed)
Pt's states no medical or surgical changes since previsit or office visit.   Pt ambulated with his cane.  It is labeled with pt's name and under his stretcher.  FALL RISK sign hanging from IV pole.  maw

## 2021-10-28 NOTE — Patient Instructions (Signed)
Handouts Provided: Polyps and Diverticulosis  YOU HAD AN ENDOSCOPIC PROCEDURE TODAY AT [facility]:   Refer to the procedure report that was given to you for any specific questions about what was found during the examination.  If the procedure report does not answer your questions, please call your gastroenterologist to clarify.  If you requested that your care partner not be given the details of your procedure findings, then the procedure report has been included in a sealed envelope for you to review at your convenience later.  YOU SHOULD EXPECT: Some feelings of bloating in the abdomen. Passage of more gas than usual.  Walking can help get rid of the air that was put into your GI tract during the procedure and reduce the bloating. If you had a lower endoscopy (such as a colonoscopy or flexible sigmoidoscopy) you may notice spotting of blood in your stool or on the toilet paper. If you underwent a bowel prep for your procedure, you may not have a normal bowel movement for a few days.  Please Note:  You might notice some irritation and congestion in your nose or some drainage.  This is from the oxygen used during your procedure.  There is no need for concern and it should clear up in a day or so.  SYMPTOMS TO REPORT IMMEDIATELY:  Following lower endoscopy (colonoscopy or flexible sigmoidoscopy):  Excessive amounts of blood in the stool  Significant tenderness or worsening of abdominal pains  Swelling of the abdomen that is new, acute  Fever of 100F or higher  For urgent or emergent issues, a gastroenterologist can be reached at any hour by calling [phone]. Do not use MyChart messaging for urgent concerns.    DIET:  We do recommend a small meal at first, but then you may proceed to your regular diet.  Drink plenty of fluids but you should avoid alcoholic beverages for 24 hours.  ACTIVITY:  You should plan to take it easy for the rest of today and you should NOT DRIVE or  use heavy machinery until tomorrow (because of the sedation medicines used during the test).    FOLLOW UP: Our staff will call the number listed on your records the next business day following your procedure.  We will call around 7:15- 8:00 am to check on you and address any questions or concerns that you may have regarding the information given to you following your procedure. If we do not reach you, we will leave a message.  If you develop any symptoms (ie: fever, flu-like symptoms, shortness of breath, cough etc.) before then, please call [phone].  If you test positive for Covid 19 in the 2 weeks post procedure, please call and report this information to [place].    If any biopsies were taken you will be contacted by phone or by letter within the next 1-3 weeks.  Please call us at [phone] if you have not heard about the biopsies in 3 weeks.    SIGNATURES/CONFIDENTIALITY: You and/or your care partner have signed paperwork which will be entered into your electronic medical record.  These signatures attest to the fact that that the information above on your After Visit Summary has been reviewed and is understood.  Full responsibility of the confidentiality of this discharge information lies with you and/or your care-partner.

## 2021-10-29 ENCOUNTER — Telehealth: Payer: Self-pay | Admitting: *Deleted

## 2021-10-29 NOTE — Telephone Encounter (Signed)
  Follow up Call-     10/28/2021    9:56 AM  Call back number  Post procedure Call Back phone  # [phone] cell  Permission to leave phone message Yes     Patient questions:  Do you have a fever, pain , or abdominal swelling? No. Pain Score  0 *  Have you tolerated food without any problems? Yes.    Have you been able to return to your normal activities? Yes.    Do you have any questions about your discharge instructions: Diet   No. Medications  No. Follow up visit  No.  Do you have questions or concerns about your Care? No.  Actions: * If pain score is 4 or above: No action needed, pain <4.

## 2021-11-03 ENCOUNTER — Encounter: Payer: Self-pay | Admitting: Internal Medicine

## 2021-11-03 ENCOUNTER — Telehealth: Payer: Self-pay | Admitting: Internal Medicine

## 2021-11-03 NOTE — Telephone Encounter (Signed)
Agree, his diverticulosis was mild and there was no apparent complicating features. I think a colovesicular fistula is very very unlikely. I would recommend that he discuss his recurrent UTI further with primary care or urology if primary care feels needed.

## 2021-11-03 NOTE — Telephone Encounter (Signed)
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 He has had 2 UTI's recently and thinks he has another one. He wants to know if Dr. Hilarie Fredrickson checked for this during the colon. Discussed with pt that Dr. Hilarie Fredrickson would have probably seen this during the procedure and his diverticulosis was listed as mild. Let him know the note would be sent to Dr. Hilarie Fredrickson for his review. Please advise.

## 2021-11-03 NOTE — Telephone Encounter (Signed)
Pt aware of Dr. Vena Rua comments and recommendations.

## 2021-12-16 IMAGING — DX DG KNEE 1-2V*R*
2 series · 2 of 2 positions shown · non-contrast
Comparison: None.

CLINICAL DATA: Total knee replacement

EXAM:
RIGHT KNEE - 1-2 VIEW

[knee ap]
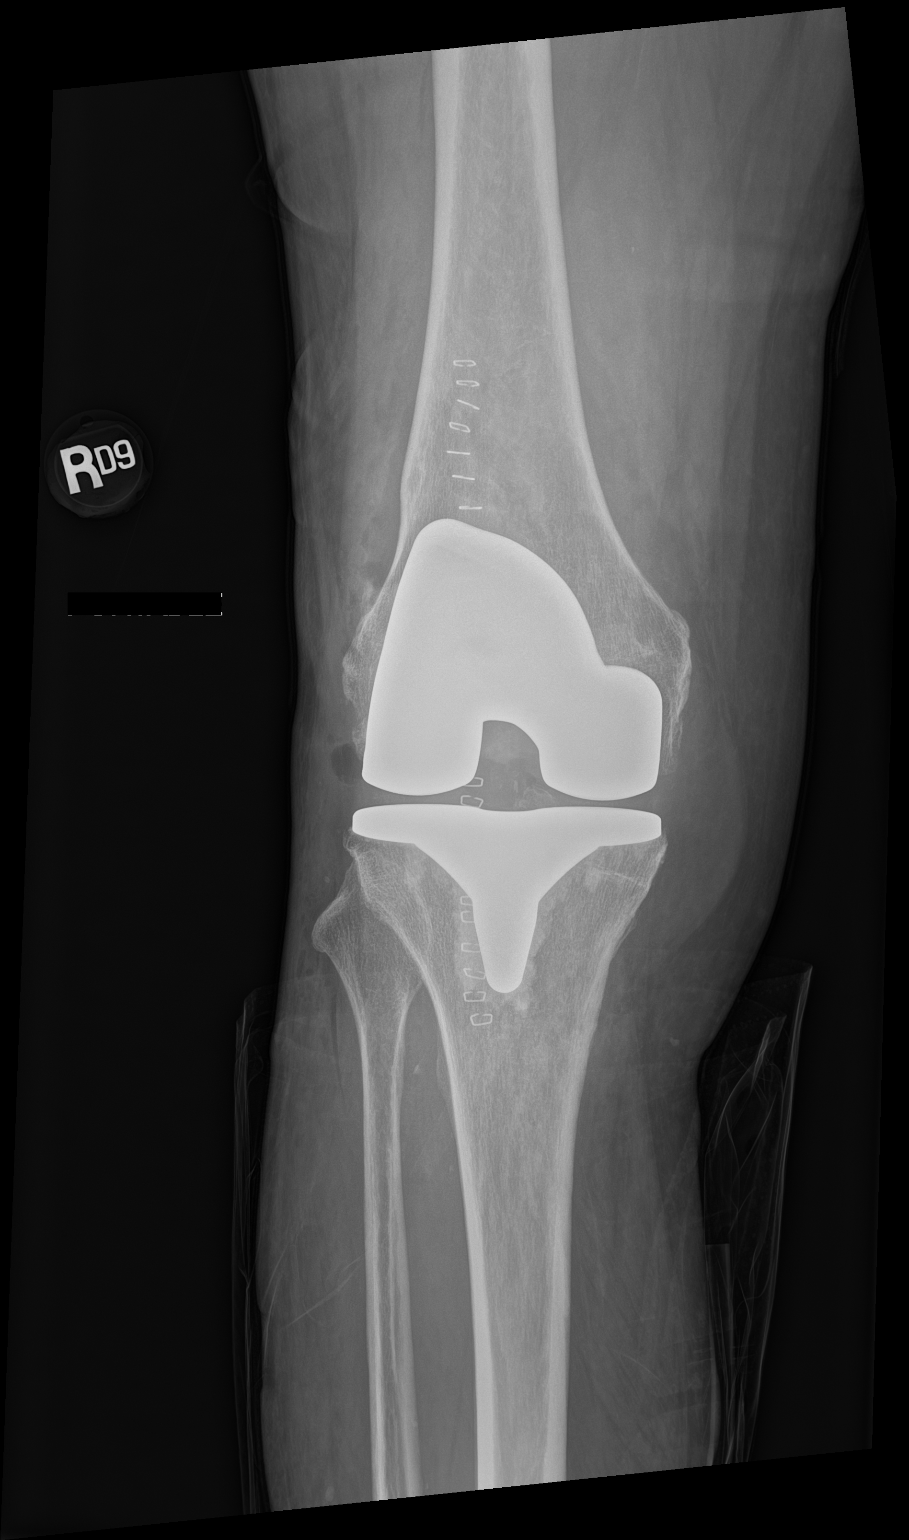

[knee lat]
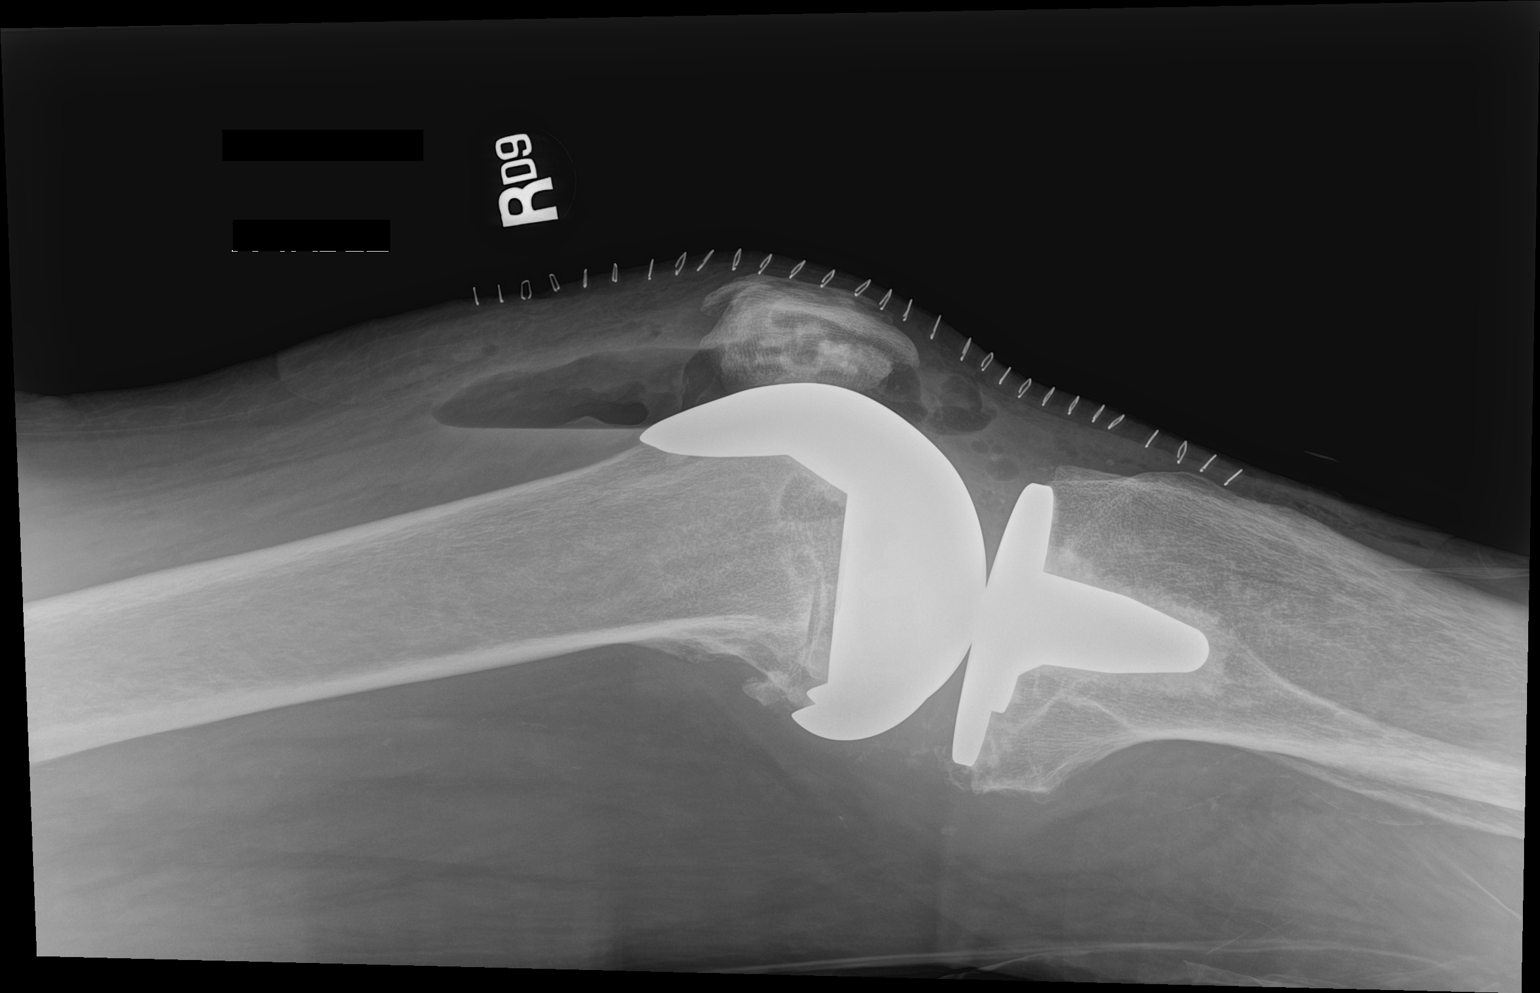

[2 of 2 positions shown; findings below may reference images not displayed]

FINDINGS: There are postoperative changes of right total knee arthroplasty.
Postoperative soft tissue swelling and air. Skin staples. No
evidence of complication.
IMPRESSION: Expected postoperative appearance of right total knee arthroplasty.

## 2024-02-15 ENCOUNTER — Ambulatory Visit: Payer: Self-pay | Admitting: Orthopedic Surgery

## 2024-04-09 ENCOUNTER — Ambulatory Visit: Payer: Self-pay | Admitting: Orthopedic Surgery

## 2024-04-09 NOTE — H&P (Signed)
 Vincent Liu is an 73 y.o. male.   Chief Complaint: left knee pain HPI: Patient is here for his H&P. Patient is scheduled for a LTKR by Dr. Duwayne on 04/25/24 at [facility]. Patient states that he fell 11 days ago in a parking lot and abraded his left knee. It did not bleed or hurt much at the time, he was able to do his shopping that day. Reports increased L knee pain the next morning and a healing scab over the anterior knee.  Dr. Duwayne and the patient mutually agreed to proceed with a total knee replacement. Risks and benefits of the procedure were discussed including stiffness, suboptimal range of motion, persistent pain, infection requiring removal of prosthesis and reinsertion, need for prophylactic antibiotics in the future, for example, dental procedures, possible need for manipulation, revision in the future and also anesthetic complications including DVT, PE, etc. We discussed the perioperative course, time in the hospital, postoperative recovery and the need for elevation to control swelling. We also discussed the predicted range of motion and the probability that squatting and kneeling would be unobtainable in the future. In addition, postoperative anticoagulation was discussed. We have obtained preoperative medical clearance as necessary. Provided illustrated handout and discussed it in detail. They will enroll in the total joint replacement educational forum at the hospital.  He is not yet scheduled for his pre-op visit at [facility].  Past Medical History:  Diagnosis Date   Allergy    Arthritis    Asthma    Cataract    bilateral - just watching   GERD (gastroesophageal reflux disease)    Hemorrhoids    Hypertension    MG (myasthenia gravis) (HCC)    Prostate cancer (HCC)    Stroke (HCC)    10/05-2018- slight right     Past Surgical History:  Procedure Laterality Date   COLONOSCOPY  2013   polyps   greenlight procedure  2013   KNEE ARTHROSCOPY Right    PROSTATE  BIOPSY     right foot surgery     TONSILLECTOMY     TOTAL KNEE ARTHROPLASTY Right 08/13/2020   Procedure: TOTAL KNEE ARTHROPLASTY;  Surgeon: Duwayne Purchase, MD;  Location: [facility];  Service: Orthopedics;  Laterality: Right;  2.5 hrs   WISDOM TOOTH EXTRACTION      Family History  Problem Relation Age of Onset   Myasthenia gravis Mother    Prostate cancer Father    Myasthenia gravis Maternal Grandmother    Colon cancer Neg Hx    Rectal cancer Neg Hx    Stomach cancer Neg Hx    Colon polyps Neg Hx    Esophageal cancer Neg Hx    Social History:  reports that he has never smoked. He has never used smokeless tobacco. He reports that he does not drink alcohol and does not use drugs.  Allergies: Allergies[1]  Current meds: aspirin  81 mg tablet,delayed release cephALEXin 500 mg capsule finasteride  5 mg tablet mycophenolate  mofetiL 500 mg tablet potassium chloride  ER 20 mEq tablet,extended release Proair  Digihaler rosuvastatin  40 mg tablet tamsulosin  0.4 mg capsule valsartan  160 mg-hydrochlorothiazide  25 mg tablet  Review of Systems  Constitutional: Negative.   HENT: Negative.    Eyes: Negative.   Respiratory: Negative.    Cardiovascular: Negative.   Gastrointestinal: Negative.   Endocrine: Negative.   Genitourinary: Negative.   Musculoskeletal:  Positive for arthralgias, gait problem, joint swelling and myalgias.  Skin: Negative.   Psychiatric/Behavioral: Negative.  There were no vitals taken for this visit. Physical Exam Constitutional:      Appearance: Normal appearance.  HENT:     Head: Normocephalic and atraumatic.     Right Ear: External ear normal.     Left Ear: External ear normal.     Nose: Nose normal.     Mouth/Throat:     Mouth: Mucous membranes are moist.  Eyes:     Pupils: Pupils are equal, round, and reactive to light.  Cardiovascular:     Rate and Rhythm: Normal rate.     Pulses: Normal pulses.  Pulmonary:     Effort: Pulmonary effort is  normal.  Abdominal:     General: Bowel sounds are normal.  Musculoskeletal:     Cervical back: Normal range of motion.     Comments: Left knee -10 degrees of extension he flexes to 130. He is exquisitely tender medial joint line as patellofemoral pain compression mild effusion. Healing scabbed abrasion anterior knee overlying the patellar tendon region with localized erythema. No bleeding or drainage. Able to perform SLR. Ipsilateral hip and ankle exam is unremarkable straight leg raise is negative  Skin:    General: Skin is warm and dry.  Neurological:     Mental Status: He is alert.    Updated L knee 3 view xrays ordered, obtained, reviewed today with no fx, subluxation, dislocation, lytic or blastic lesions. Severe L knee DJD as previously noted, varus deformity.  Assessment/Plan Impression: L knee end-stage DJD  Plan: Pt with end-stage left knee DJD, bone-on-bone, refractory to conservative tx, scheduled for left total knee replacement by Dr. Duwayne on 12/31. We again discussed the procedure itself as well as risks, complications and alternatives, including but not limited to DVT, PE, infx, bleeding, failure of procedure, need for secondary procedure including manipulation, nerve injury, ongoing pain/symptoms, anesthesia risk, even stroke or death. Also discussed typical post-op protocols, activity restrictions, need for PT, flexion/extension exercises, time out of work. Discussed need for DVT ppx post-op per protocol. Discussed dental ppx and infx prevention. Also discussed limitations post-operatively such as kneeling and squatting. All questions were answered. Patient desires to proceed with surgery as scheduled.  Will hold supplements, ASA and NSAIDs accordingly. Will remain NPO after midnight the night before surgery. Will present to [facility] for pre-op testing. Anticipate hospital stay to include at least 2 midnights given medical history and to ensure proper pain control. Plan ASA for DVT ppx  post-op. Plan Oxy 10 vs Dilaudid, Tizanidine, Colace, Miralax . Plan home with HHPT post-op with family members at home for assistance, [facility] previously, then outpatient at [facility]. Will follow up 10-14 days post-op for staple removal and xrays.  He is aware the skin needs to be fully healed over the knee to be able to proceed with surgery. He will send me a picture in another week. We also discussed warm compresses over the knee to help soften scab and allow it to slough off. I have placed him on Keflex 500mg  QID for a week as well given the localized erythema  Plan left total knee replacement  Darice CHRISTELLA Randy, PA-C for Dr Duwayne 04/09/2024, 12:13 PM       [1]  Allergies Allergen Reactions   Ibuprofen Other (See Comments)    Conflict with other meds

## 2024-04-10 NOTE — Patient Instructions (Signed)
 SURGICAL WAITING ROOM VISITATION  Patients having surgery or a procedure may have no more than 2 support people in the waiting area - these visitors may rotate.    Children ages 43 and under will not be able to visit patients in [facility] under most circumstances.   Visitors with respiratory illnesses are discouraged from visiting and should remain at home.  If the patient needs to stay at the hospital during part of their recovery, the visitor guidelines for inpatient rooms apply. Pre-op nurse will coordinate an appropriate time for 1 support person to accompany patient in pre-op.  This support person may not rotate.    Please refer to the [facility] website for the visitor guidelines for Inpatients (after your surgery is over and you are in a regular room).       Your procedure is scheduled on: 04/25/24   Report to [facility] Main Entrance    Report to admitting at 5:15 AM   Call this number if you have problems the morning of surgery [phone]   Do not eat food :After Midnight.   After Midnight you may have the following liquids until 4:30 AM DAY OF SURGERY  Water  Non-Citrus Juices (without pulp, NO RED-Apple, White grape, White cranberry) Black Coffee (NO MILK/CREAM OR CREAMERS, sugar ok)  Clear Tea (NO MILK/CREAM OR CREAMERS, sugar ok) regular and decaf                             Plain Jell-O (NO RED)                                           Fruit ices (not with fruit pulp, NO RED)                                     Popsicles (NO RED)                                                               Sports drinks like Gatorade (NO RED)                The day of surgery:  Drink ONE (1) Pre-Surgery Clear Ensure at 4:30 AM the morning of surgery. Drink in one sitting. Do not sip.  This drink was given to you during your hospital  pre-op appointment visit. Nothing else to drink after completing the  Pre-Surgery Clear Ensure .     Oral Hygiene is  also important to reduce your risk of infection.                                    Remember - BRUSH YOUR TEETH THE MORNING OF SURGERY WITH YOUR REGULAR TOOTHPASTE  DENTURES WILL BE REMOVED PRIOR TO SURGERY PLEASE DO NOT APPLY Poly grip OR ADHESIVES!!!    Stop all vitamins and herbal supplements 7 days before surgery.   Take these medicines the morning of surgery with A SIP OF WATER : finasteride (proscar ),  cellcept , rosuvastatin , tamsulosin , inhaler             You may not have any metal on your body including hair pins, jewelry, and body piercing             Do not wear  lotions, powders, perfumes/cologne, or deodorant              Men may shave face and neck.   Do not bring valuables to the hospital. [facility] IS NOT             RESPONSIBLE   FOR VALUABLES.   Contacts, glasses, dentures or bridgework may not be worn into surgery.   Bring small overnight bag day of surgery.   DO NOT BRING YOUR HOME MEDICATIONS TO THE HOSPITAL. PHARMACY WILL DISPENSE MEDICATIONS LISTED ON YOUR MEDICATION LIST TO YOU DURING YOUR ADMISSION IN THE HOSPITAL!    Patients discharged on the day of surgery will not be allowed to drive home.  Someone NEEDS to stay with you for the first 24 hours after anesthesia.   Special Instructions: Bring a copy of your healthcare power of attorney and living will documents the day of surgery if you haven't scanned them before.              Please read over the following fact sheets you were given: IF YOU HAVE QUESTIONS ABOUT YOUR PRE-OP INSTRUCTIONS PLEASE CALL [phone] Vincent Liu   If you received a COVID test during your pre-op visit  it is requested that you wear a mask when out in public, stay away from anyone that may not be feeling well and notify your surgeon if you develop symptoms. If you test positive for Covid or have been in contact with anyone that has tested positive in the last 10 days please notify you surgeon.      Pre-operative 4 CHG Bath  Instructions  DYNA-Hex 4 Chlorhexidine  Gluconate 4% Solution Antiseptic 4 fl. oz   You can play a key role in reducing the risk of infection after surgery. Your skin needs to be as free of germs as possible. You can reduce the number of germs on your skin by washing with CHG (chlorhexidine  gluconate) soap before surgery. CHG is an antiseptic soap that kills germs and continues to kill germs even after washing.   DO NOT use if you have an allergy to chlorhexidine /CHG or antibacterial soaps. If your skin becomes reddened or irritated, stop using the CHG and notify one of our RNs at   Please shower with the CHG soap starting 4 days before surgery using the following schedule:     Please keep in mind the following:  DO NOT shave, including legs and underarms, starting the day of your first shower.   You may shave your face at any point before/day of surgery.  Place clean sheets on your bed the day you start using CHG soap. Use a clean washcloth (not used since being washed) for each shower. DO NOT sleep with pets once you start using the CHG.  CHG Shower Instructions:  If you choose to wash your hair and private area, wash first with your normal shampoo/soap.  After you use shampoo/soap, rinse your hair and body thoroughly to remove shampoo/soap residue.  Turn the water  OFF and apply about 3 tablespoons (45 ml) of CHG soap to a CLEAN washcloth.  Apply CHG soap ONLY FROM YOUR NECK DOWN TO YOUR TOES (washing for 3-5 minutes)  DO NOT use CHG soap  on face, private areas, open wounds, or sores.  Pay special attention to the area where your surgery is being performed.  If you are having back surgery, having someone wash your back for you may be helpful. Wait 2 minutes after CHG soap is applied, then you may rinse off the CHG soap.  Pat dry with a clean towel  Put on clean clothes/pajamas   If you choose to wear lotion, please use ONLY the CHG-compatible lotions on the back of this paper.      Additional instructions for the day of surgery: DO NOT APPLY any lotions, deodorants, cologne, or perfumes.   Put on clean/comfortable clothes.  Brush your teeth.  Ask your nurse before applying any prescription medications to the skin.   CHG Compatible Lotions   Aveeno Moisturizing lotion  Cetaphil Moisturizing Cream  Cetaphil Moisturizing Lotion  Clairol Herbal Essence Moisturizing Lotion, Dry Skin  Clairol Herbal Essence Moisturizing Lotion, Extra Dry Skin  Clairol Herbal Essence Moisturizing Lotion, Normal Skin  Curel Age Defying Therapeutic Moisturizing Lotion with Alpha Hydroxy  Curel Extreme Care Body Lotion  Curel Soothing Hands Moisturizing Hand Lotion  Curel Therapeutic Moisturizing Cream, Fragrance-Free  Curel Therapeutic Moisturizing Lotion, Fragrance-Free  Curel Therapeutic Moisturizing Lotion, Original Formula  Eucerin Daily Replenishing Lotion  Eucerin Dry Skin Therapy Plus Alpha Hydroxy Crme  Eucerin Dry Skin Therapy Plus Alpha Hydroxy Lotion  Eucerin Original Crme  Eucerin Original Lotion  Eucerin Plus Crme Eucerin Plus Lotion  Eucerin TriLipid Replenishing Lotion  Keri Anti-Bacterial Hand Lotion  Keri Deep Conditioning Original Lotion Dry Skin Formula Softly Scented  Keri Deep Conditioning Original Lotion, Fragrance Free Sensitive Skin Formula  Keri Lotion Fast Absorbing Fragrance Free Sensitive Skin Formula  Keri Lotion Fast Absorbing Softly Scented Dry Skin Formula  Keri Original Lotion  Keri Skin Renewal Lotion Keri Silky Smooth Lotion  Keri Silky Smooth Sensitive Skin Lotion  Nivea Body Creamy Conditioning Oil  Nivea Body Extra Enriched Lotion  Nivea Body Original Lotion  Nivea Body Sheer Moisturizing Lotion Nivea Crme  Nivea Skin Firming Lotion  NutraDerm 30 Skin Lotion  NutraDerm Skin Lotion  NutraDerm Therapeutic Skin Cream  NutraDerm Therapeutic Skin Lotion  ProShield Protective Hand Cream   Incentive Spirometer  An incentive  spirometer is a tool that can help keep your lungs clear and active. This tool measures how well you are filling your lungs with each breath. Taking long deep breaths may help reverse or decrease the chance of developing breathing (pulmonary) problems (especially infection) following: A long period of time when you are unable to move or be active. BEFORE THE PROCEDURE  If the spirometer includes an indicator to show your best effort, your nurse or respiratory therapist will set it to a desired goal. If possible, sit up straight or lean slightly forward. Try not to slouch. Hold the incentive spirometer in an upright position. INSTRUCTIONS FOR USE  Sit on the edge of your bed if possible, or sit up as far as you can in bed or on a chair. Hold the incentive spirometer in an upright position. Breathe out normally. Place the mouthpiece in your mouth and seal your lips tightly around it. Breathe in slowly and as deeply as possible, raising the piston or the ball toward the top of the column. Hold your breath for 3-5 seconds or for as long as possible. Allow the piston or ball to fall to the bottom of the column. Remove the mouthpiece from your mouth and breathe out normally. Rest  for a few seconds and repeat Steps 1 through 7 at least 10 times every 1-2 hours when you are awake. Take your time and take a few normal breaths between deep breaths. The spirometer may include an indicator to show your best effort. Use the indicator as a goal to work toward during each repetition. After each set of 10 deep breaths, practice coughing to be sure your lungs are clear. If you have an incision (the cut made at the time of surgery), support your incision when coughing by placing a pillow or rolled up towels firmly against it. Once you are able to get out of bed, walk around indoors and cough well. You may stop using the incentive spirometer when instructed by your caregiver.  RISKS AND COMPLICATIONS Take your time  so you do not get dizzy or light-headed. If you are in pain, you may need to take or ask for pain medication before doing incentive spirometry. It is harder to take a deep breath if you are having pain. AFTER USE Rest and breathe slowly and easily. It can be helpful to keep track of a log of your progress. Your caregiver can provide you with a simple table to help with this. If you are using the spirometer at home, follow these instructions: SEEK MEDICAL CARE IF:  You are having difficultly using the spirometer. You have trouble using the spirometer as often as instructed. Your pain medication is not giving enough relief while using the spirometer. You develop fever of 100.5 F (38.1 C) or higher. SEEK IMMEDIATE MEDICAL CARE IF:  You cough up bloody sputum that had not been present before. You develop fever of 102 F (38.9 C) or greater. You develop worsening pain at or near the incision site. MAKE SURE YOU:  Understand these instructions. Will watch your condition. Will get help right away if you are not doing well or get worse. Document Released: 08/23/2006 Document Revised: 07/05/2011 Document Reviewed: 10/24/2006 [facility] Patient Information 2014 [place], [place].

## 2024-04-10 NOTE — Progress Notes (Signed)
 COVID Vaccine received:  []  No [x]  Yes Date of any COVID positive Test in last 90 days: no PCP - Dr. Norleen Guillaume In [place]. Cardiologist - n/a Neurology- Dr. Dorn Pyle @ [facility].  Chest x-ray -  EKG -   Stress Test -  ECHO -  Cardiac Cath -   Bowel Prep - [x]  No  []   Yes ______  Pacemaker / ICD device [x]  No []  Yes   Spinal Cord Stimulator:[x]  No []  Yes       History of Sleep Apnea? [x]  No []  Yes   CPAP used?- [x]  No []  Yes    Does the patient monitor blood sugar?          [x]  No []  Yes  []  N/A  Patient has: [x]  NO Hx DM   []  Pre-DM                 []  DM1  []   DM2 Does patient have a [employer] or Dexacom? [x]  No []  Yes   Fasting Blood Sugar Ranges-  Checks Blood Sugar _____ times a day  GLP1 agonist / usual dose - no GLP1 instructions:  SGLT-2 inhibitors / usual dose no-  SGLT-2 instructions:   Blood Thinner / Instructions:no Aspirin  Instructions:ASA 81 mg Stop 5 days prior to surgery. Last dose to be 04/19/24.  Comments:   Activity level: Patient is unable to climb a flight of stairs without difficulty; [x]  No CP  [x]  No SOB, but would have _Knee dysfunction__   Patient can  perform ADLs without assistance.   Anesthesia review: CVA 2020, HTN, Myasthenia Gravis  Patient denies shortness of breath, fever, cough and chest pain at PAT appointment.  Patient verbalized understanding and agreement to the Pre-Surgical Instructions that were given to them at this PAT appointment. Patient was also educated of the need to review these PAT instructions again prior to his/her surgery.I reviewed the appropriate phone numbers to call if they have any and questions or concerns.

## 2024-04-12 ENCOUNTER — Encounter (HOSPITAL_COMMUNITY): Payer: Self-pay

## 2024-04-12 ENCOUNTER — Encounter (HOSPITAL_COMMUNITY)
Admission: RE | Admit: 2024-04-12 | Discharge: 2024-04-12 | Disposition: A | Discharge status: Home / Self Care | Source: Ambulatory Visit | Attending: Specialist | Admitting: Specialist

## 2024-04-12 ENCOUNTER — Other Ambulatory Visit: Payer: Self-pay

## 2024-04-12 VITALS — BP 119/71 | HR 87 | Temp 98.2°F | Resp 18 | Ht 73.0 in | Wt 250.0 lb

## 2024-04-12 DIAGNOSIS — J45909 Unspecified asthma, uncomplicated: Secondary | ICD-10-CM | POA: Insufficient documentation

## 2024-04-12 DIAGNOSIS — Z8673 Personal history of transient ischemic attack (TIA), and cerebral infarction without residual deficits: Secondary | ICD-10-CM | POA: Diagnosis not present

## 2024-04-12 DIAGNOSIS — I10 Essential (primary) hypertension: Secondary | ICD-10-CM | POA: Diagnosis not present

## 2024-04-12 DIAGNOSIS — Z01818 Encounter for other preprocedural examination: Secondary | ICD-10-CM | POA: Insufficient documentation

## 2024-04-12 DIAGNOSIS — G7000 Myasthenia gravis without (acute) exacerbation: Secondary | ICD-10-CM | POA: Diagnosis not present

## 2024-04-12 DIAGNOSIS — Z8546 Personal history of malignant neoplasm of prostate: Secondary | ICD-10-CM | POA: Insufficient documentation

## 2024-04-12 DIAGNOSIS — M170 Bilateral primary osteoarthritis of knee: Secondary | ICD-10-CM | POA: Insufficient documentation

## 2024-04-12 LAB — BASIC METABOLIC PANEL WITH GFR
Anion gap: 7 (ref 5–15)
BUN: 19 mg/dL (ref 8–23)
CO2: 28 mmol/L (ref 22–32)
Calcium: 9.3 mg/dL (ref 8.9–10.3)
Chloride: 103 mmol/L (ref 98–111)
Creatinine, Ser: 0.7 mg/dL (ref 0.61–1.24)
GFR, Estimated: >60 mL/min (ref >60)
Glucose, Bld: 110 mg/dL — ABNORMAL HIGH (ref 70–99)
Potassium: 4.1 mmol/L (ref 3.5–5.1)
Sodium: 138 mmol/L (ref 135–145)

## 2024-04-12 LAB — CBC
HCT: 41 % (ref 39.0–52.0)
Hemoglobin: 12.9 g/dL — ABNORMAL LOW (ref 13.0–17.0)
MCH: 26.3 pg (ref 26.0–34.0)
MCHC: 31.5 g/dL (ref 30.0–36.0)
MCV: 83.5 fL (ref 80.0–100.0)
Platelets: 145 K/uL — ABNORMAL LOW (ref 150–400)
RBC: 4.91 MIL/uL (ref 4.22–5.81)
RDW: 15.1 % (ref 11.5–15.5)
WBC: 8.7 K/uL (ref 4.0–10.5)
nRBC: 0 % (ref 0.0–0.2)

## 2024-04-12 LAB — SURGICAL PCR SCREEN
MRSA, PCR: NEGATIVE
Staphylococcus aureus: NEGATIVE

## 2024-04-14 NOTE — Progress Notes (Signed)
 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 BMI is consistent with obesity.   Patient is followed by Neurology at [facility]. Last seen on 01/02/24. Noted to be stable. Offered taking higher dose of Cellcept  but patient declined. Advised f/u in 8 months.  Hx of asthma. On inhalers.  VS: BP 119/71   Pulse 87   Temp 36.8 C (Oral)   Resp 18   Ht 6' 1 (1.854 m)   Wt 113.4 kg   SpO2 95%   BMI 32.98 kg/m   PROVIDERS: [name], Norleen Lenis, MD   LABS: Labs reviewed: Acceptable for surgery. (all labs ordered are listed, but only abnormal results are displayed)  Labs Reviewed  BASIC METABOLIC PANEL WITH GFR - Abnormal; Notable for the following components:      Result Value   Glucose, Bld 110 (*)    All other components within normal limits  CBC - Abnormal; Notable for the following components:   Hemoglobin 12.9 (*)    Platelets 145 (*)    All other components within normal limits  SURGICAL PCR SCREEN     IMAGES:   EKG 04/12/24:  Sinus rhythm with PACs Low voltage QRS Cannot rule out inferior infarct   Stress echo 05/23/2013 ([facility]):  INTERPRETATION Normal Stress Echocardiogram   WITH MILD LVH  Echo 05/02/2013 ([facility]):  INTERPRETATION TECHNICALLY LIMITED STUDY PRESERVED LV FUNCTION NORMAL WALL MOTION EF OVER 60% NORMAL RV NO PERICARDIAL EFFUSION VERY MILD MAC MILD MODERATE LAE 5.1CM MILD CONCENTRIC LVH MILD DIASTOLIC DYSFUNCTION  Past Medical History:  Diagnosis Date   Allergy     Arthritis    Asthma    Cataract    bilateral - just watching   GERD (gastroesophageal reflux disease)    Hemorrhoids    Hypertension    MG (myasthenia gravis) (HCC)    Prostate cancer (HCC)    Stroke (HCC)    10/05-2018- slight right     Past Surgical History:  Procedure Laterality Date   COLONOSCOPY  2013   polyps   greenlight procedure  2013   KNEE ARTHROSCOPY Right    PROSTATE BIOPSY     right foot surgery     TONSILLECTOMY     TOTAL KNEE ARTHROPLASTY Right 08/13/2020   Procedure: TOTAL KNEE ARTHROPLASTY;  Surgeon: Duwayne Purchase, MD;  Location: [facility];  Service: Orthopedics;  Laterality: Right;  2.5 hrs   WISDOM TOOTH EXTRACTION      MEDICATIONS:  albuterol  (PROVENTIL ) (2.5 MG/3ML) 0.083% nebulizer solution   albuterol  (VENTOLIN  HFA) 108 (90 Base) MCG/ACT inhaler   aspirin  EC 81 MG tablet   budesonide-formoterol (BREYNA) 80-4.5 MCG/ACT inhaler   CRANBERRY-D MANNOSE PO   finasteride  (PROSCAR ) 5 MG tablet   guaiFENesin (MUCINEX) 600 MG 12 hr tablet   Multiple Vitamin (MULTIVITAMIN WITH MINERALS) TABS tablet   mycophenolate  (CELLCEPT ) 500 MG tablet   potassium chloride  SA (KLOR-CON ) 20 MEQ tablet   rosuvastatin  (CRESTOR ) 40 MG tablet   tamsulosin  (FLOMAX ) 0.4 MG CAPS capsule   valsartan -hydrochlorothiazide  (DIOVAN -HCT) 160-25  MG tablet   No current facility-administered medications for this encounter.   Burnard CHRISTELLA Odis DEVONNA [facility] Short Stay/Anesthesiology [facility] Phone [phone] 04/14/2024 7:09 PM        "

## 2024-04-16 NOTE — Anesthesia Preprocedure Evaluation (Signed)
"                                    Anesthesia Evaluation    Airway        Dental   Pulmonary           Cardiovascular hypertension,      Neuro/Psych    GI/Hepatic   Endo/Other    Renal/GU      Musculoskeletal   Abdominal   Peds  Hematology   Anesthesia Other Findings   Reproductive/Obstetrics                              Anesthesia Physical Anesthesia Plan  ASA:   Anesthesia Plan:    Post-op Pain Management:    Induction:   PONV Risk Score and Plan:   Airway Management Planned:   Additional Equipment:   Intra-op Plan:   Post-operative Plan:   Informed Consent:   Plan Discussed with:   Anesthesia Plan Comments: (See PAT note from 12/18)         Anesthesia Quick Evaluation  "

## 2024-04-25 ENCOUNTER — Encounter (HOSPITAL_COMMUNITY): Admission: RE | Payer: Self-pay | Source: Ambulatory Visit

## 2024-04-25 ENCOUNTER — Encounter (HOSPITAL_COMMUNITY): Payer: Self-pay | Admitting: Medical

## 2024-04-25 ENCOUNTER — Ambulatory Visit (HOSPITAL_COMMUNITY): Admission: RE | Admit: 2024-04-25 | Source: Ambulatory Visit | Admitting: Specialist

## 2024-04-25 SURGERY — ARTHROPLASTY, KNEE, TOTAL
Anesthesia: Spinal | Site: Knee | Laterality: Left
